# Patient Record
Sex: Female | Born: 1980 | Hispanic: Yes | Marital: Married | Smoking: Never smoker
Health system: Southern US, Community
[De-identification: ages and names within clinical notes are randomized; demographics above are authoritative.]

## PROBLEM LIST (undated history)

## (undated) DIAGNOSIS — I10 Essential (primary) hypertension: Secondary | ICD-10-CM

## (undated) DIAGNOSIS — K279 Peptic ulcer, site unspecified, unspecified as acute or chronic, without hemorrhage or perforation: Secondary | ICD-10-CM

## (undated) DIAGNOSIS — Z789 Other specified health status: Secondary | ICD-10-CM

## (undated) DIAGNOSIS — N189 Chronic kidney disease, unspecified: Secondary | ICD-10-CM

## (undated) DIAGNOSIS — E785 Hyperlipidemia, unspecified: Secondary | ICD-10-CM

## (undated) DIAGNOSIS — K760 Fatty (change of) liver, not elsewhere classified: Secondary | ICD-10-CM

## (undated) HISTORY — DX: Fatty (change of) liver, not elsewhere classified: K76.0

---

## 2005-05-04 ENCOUNTER — Ambulatory Visit: Payer: Self-pay | Admitting: Family Medicine

## 2005-05-08 ENCOUNTER — Ambulatory Visit: Payer: Self-pay | Admitting: Obstetrics and Gynecology

## 2005-05-08 ENCOUNTER — Ambulatory Visit: Payer: Self-pay | Admitting: Obstetrics & Gynecology

## 2005-05-08 ENCOUNTER — Inpatient Hospital Stay (HOSPITAL_COMMUNITY): Admission: AD | Admit: 2005-05-08 | Discharge: 2005-05-16 | Payer: Self-pay | Admitting: Obstetrics and Gynecology

## 2005-05-11 ENCOUNTER — Encounter (INDEPENDENT_AMBULATORY_CARE_PROVIDER_SITE_OTHER): Payer: Self-pay | Admitting: Specialist

## 2005-05-22 ENCOUNTER — Inpatient Hospital Stay (HOSPITAL_COMMUNITY): Admission: AD | Admit: 2005-05-22 | Discharge: 2005-05-22 | Payer: Self-pay | Admitting: Obstetrics & Gynecology

## 2005-05-25 ENCOUNTER — Ambulatory Visit: Payer: Self-pay | Admitting: Family Medicine

## 2007-04-18 ENCOUNTER — Ambulatory Visit (HOSPITAL_COMMUNITY): Admission: RE | Admit: 2007-04-18 | Discharge: 2007-04-18 | Payer: Self-pay | Admitting: Family Medicine

## 2007-04-19 ENCOUNTER — Inpatient Hospital Stay (HOSPITAL_COMMUNITY): Admission: AD | Admit: 2007-04-19 | Discharge: 2007-04-19 | Payer: Self-pay | Admitting: Obstetrics & Gynecology

## 2007-08-02 ENCOUNTER — Ambulatory Visit: Payer: Self-pay | Admitting: Advanced Practice Midwife

## 2007-08-02 ENCOUNTER — Inpatient Hospital Stay (HOSPITAL_COMMUNITY): Admission: AD | Admit: 2007-08-02 | Discharge: 2007-08-02 | Payer: Self-pay | Admitting: Obstetrics and Gynecology

## 2007-08-09 ENCOUNTER — Ambulatory Visit: Payer: Self-pay | Admitting: Obstetrics and Gynecology

## 2007-08-09 ENCOUNTER — Inpatient Hospital Stay (HOSPITAL_COMMUNITY): Admission: AD | Admit: 2007-08-09 | Discharge: 2007-08-12 | Payer: Self-pay | Admitting: Obstetrics and Gynecology

## 2007-08-14 ENCOUNTER — Ambulatory Visit: Payer: Self-pay | Admitting: Obstetrics & Gynecology

## 2010-12-20 NOTE — H&P (Signed)
NAME:  Nichole Jackson, Nichole Jackson NO.:  [id number]   MEDICAL RECORD NO.:  [id number]          PATIENT TYPE:  INP   LOCATION:  9160                          [facility]ILITY[facility]   PHYSICIAN:  Tilda Burrow, M.D. DATE OF BIRTH:  [birth year]   DATE OF ADMISSION:  08/09/2007  DATE OF DISCHARGE:                              HISTORY & PHYSICAL   This is 30 year old Hispanic female, gravida 2, para 1-0-0-1, prior C-  section x1, now at 41 weeks, 2 days by late second trimester ultrasound  (+/- 14 days), [redacted] weeks gestation at initial assessment.  Is admitted  for induction of labor to address the question of possible post dates.  She was followed through the health department for prenatal care.  Had  two periods in March, October 19, 2006 and October 30, 2006, raising  uncertain menstrual EDC.  Ultrasound on July 31, 2007 is the  suspected [facility] based on ultrasound performed on April 18, 2007.  She  has a prior history of cesarean section after a pregnancy for which she  became hypertensive, and at 38+ weeks gestation, was admitted to the  hospital for five days and induced due to elevated blood pressure and  reduced amniotic fluid with brief labor resulted in cesarean delivery  after she developed oligohydramnios.  This pregnancy has been relatively  uneventful, although the baby is perceived by the patient as being  larger.   Review of her records indicated that the patient had not signed the C-  section consent form, and prior to any interventions, we went over  vaginal birth after cesarean with risks of the procedure, including  specific review of the risks of uterine rupture with possible  complications of uterine rupture reviewed, including fetal damage and  injury.  She has specifically line by line reviewed the [facility]  clinic's consent for vaginal birth after cesarean, based on ACOG data,  and has signed this with all questions answered in great detail through  translator and directly.  The patient makes it clear that while she  would like to deliver vaginally, that if there become developed  concerns, she is completely comfortable with proceeding towards cesarean  delivery.   PAST MEDICAL HISTORY:  Benign.   PAST SURGICAL HISTORY:  C-section with complications of chorioamniotis  and postoperative wound infection.  She appears to have been admitted  for the postoperative infection as a readmission.   GENETIC HISTORY:  Negative.   FAMILY HISTORY:  Positive for hypertension and routine chronic diseases  only.   PHYSICAL EXAMINATION:  GENERAL:  A healthy Hispanic female, alert and  oriented x3.  Height 5 foot 4.  Weight approximately 200, which represents an  approximately 10 pound weight gain.  Pupils are equal, round and reactive.  NECK:  Supple.  CHEST:  Clear to auscultation.  ABDOMEN:  Gravid uterus.  Estimated fetal weight is 7-8 pounds.  Cervix is 2 cm, 50% posterior, -2, with vertex confirmed, and forewaters  palpable.   After consent is obtained, we placed a Foley bulb for cervical ripening  with 50 cc of fluid volume inserted.   Lab results, including hemoglobin initially  12, hematocrit 39, platelets  202.  Blood type O+.  Antibody screen negative.  RPR negative.  Hepatitis negative.  HIV nonreactive.  GC and Chlamydia negative.  Pap  smear normal.  One hour Glucola 105 mg%.  Group B strep test negative.   Review of records showed the primary low transverse C-section performed  in October, 2006.   PLAN:  Foley bulb cervical ripening overnight.  Pitocin induction to be  given in the a.m.  Uncertain prognosis for vaginal delivery.  Will  remove promptly to cesarean delivery if evidence of fetal concerns  occur.  Patient plans to use epidural for analgesia.  She does not plan  circumcision.      Tilda Burrow, M.D.  Electronically Signed     JVF/MEDQ  D:  08/10/2007  T:  08/10/2007  Job:  [id number]   cc:   [facility]  Pediatrics

## 2010-12-20 NOTE — Op Note (Signed)
Nichole Jackson, Nichole Jackson       ACCOUNT NO.:  [id number]   MEDICAL RECORD NO.:  [id number]          PATIENT TYPE:  INP   LOCATION:  9113                          [facility]   PHYSICIAN:  Tilda Burrow, M.D. DATE OF BIRTH:  11/10/1980   DATE OF PROCEDURE:  08/10/2007  DATE OF DISCHARGE:                               OPERATIVE REPORT   PREOPERATIVE DIAGNOSES:  Pregnancy 41+ weeks gestation.  Failed  induction, failed vaginal birth after cesarean attempt.   PROCEDURE:  Repeat cesarean section lower uterine segment vertical  incision with extension.   SURGEON:  Tilda Burrow, M.D.   ASSISTANT:  None.   ANESTHESIA:  Spinal.   COMPLICATIONS:  None.   FINDINGS:  Dense, firm fibrotic adhesions from the lower uterine segment  anterior abdominal wall obliterating the anterior dissection planes.  Also, extensive omental adhesions to the uterine fundus and anterior  portion of the uterus, relatively free adnexal structures.   DETAILS OF PROCEDURE:  The patient was taken to the operating room,  prepped and draped for Pfannenstiel type incision which was repeated and  sharp excision down through the fatty tissue to the fascia.  The fascia  was very dense, fibrotic and adherent to the rectus muscles.  Careful  dissection allowed adequacy of the facial opening.  The rectus muscles  were identified and opened in the midline, careful to remain high on the  abdomen.  We gradually worked our way in and encountered dense fibrotic  tissue, which required difficulty to dissect through.  Uterine  musculature was encountered with no space between fibrotic tissues and  uterus.  We marched even further cephalad with the fascial release and  we were able to identify a more flexible position that blunt digital  exam could allow [place] to identify the peritoneal opening.  Using the  operator's index finger to better delineate the anatomy, we could  identify that we were in the lower uterine  segment area midline.  Some  of the dense fibrosis could be cut free, but in order to access the  uterus and access the baby, we decided a vertical lower uterine segment  incision was necessary.  We then proceeded with this incision, going  down to the amniotic bag of water at the level of the fetal neck.  The  incision was extended caudad, while being careful to identify bladder  structures and pull them inferiorly.  A small cephalad extension of the  incision was necessary to maintain adequate access to rotate the baby  and deliver it by fundal pressure.  The cord was clamped and the infant  passed to awaiting pediatricians in good condition, with Apgars 8 and 9  assigned.  Amniotic fluid was clear, without malodor.  The infant was a  large framed infant, without any molding of the vertex.   The placenta was delivered by  __Crede_massage to the uterus and then  attention directed to the uterus.  The uterus was irrigated, membrane  remnants carefully inspected and extracted when identified.  The ring  forceps were placed at the upper and lower portion of the uterine  incision and the uterine  incision was then closed in the midline, with 2  layer running locking closure.  There was some asymmetry to the incision  at its top and it appeared there had been a transverse extension of the  incision.  Initially this was closed transversely and then after  reassessment, was taken down and closed vertically, as it became  apparent that this was due to muscular retraction of the sides of the  uterine incision.  This allowed for a successful second layer closure.  At this time, the uterus was found to have extensive fundal adhesions  and these could be dissected free, the tips of the omental pad were  inspected and confirmed as hemostatic.  The uterus could then be  exteriorized enough to confirm good hemostasis.  The rectus muscles were  loosely reapproximated with some 2-0 chromic, the fascia  closed using a  continuous running zero Vicryl and subcutaneous tissues were irrigated,  inspected, confirmed as hemostatic, closed with 2 interrupted sutures of  2-0 plain and staple closure of the skin completed procedure.   ESTIMATED BLOOD LOSS:  Estimated blood loss 600 to 800 mL.   CONDITION ON DISCHARGE:  Tolerated well by the patient.   Urine output was monitored throughout the case.  She had several hundred  mL of urine during the case, with no blood encountered.  At no time did  we suspect bladder injury.   ADDENDUM:  Should the patient go to repeat cesarean section in the  future, a lower abdominal vertical incision may be necessary, given the  density of the fibrosis and the technical challenges of access in the  pelvis.      Tilda Burrow, M.D.  Electronically Signed     JVF/MEDQ  D:  08/10/2007  T:  08/10/2007  Job:  [id number]

## 2010-12-20 NOTE — Discharge Summary (Signed)
NAMELYRIK, DOCKSTADER       ACCOUNT NO.:  [id number]   MEDICAL RECORD NO.:  [id number]          PATIENT TYPE:  INP   LOCATION:  9113                          [facility]   PHYSICIAN:  Tilda Burrow, M.D. DATE OF BIRTH:  March 24, 1981   DATE OF ADMISSION:  08/09/2007  DATE OF DISCHARGE:  08/12/2007                               DISCHARGE SUMMARY   REASON FOR ADMISSION:  Trial of labor, failed.  Repeat low transverse  cesarean section.   HOSPITAL COURSE:  Has been uneventful.  Patient is taking p.o. fluids  and food well without having any problems.  She is up, ambulating well  without difficulty.  She desires discharge home today.   PHYSICAL EXAMINATION:  VITAL SIGNS:  Stable.  HEART:  Regular to rhythm and rate.  LUNGS:  Clear to auscultation bilaterally.  ABDOMEN:  Soft.  Bowel sounds are active in all 4 quadrants.  Incision  is dry, intact.  No signs of redness, drainage or swelling.  Staples  intact.  Lochia is small amount.  Fundus is firm at -1 U.  There is 1+  edema in lower extremities.   DISCHARGE MEDICATIONS:  1. Micronor 1 p.o. q. day.  2. Motrin 600 one p.o. q.6 hours p.r.n. cramping.  3. Percocet 5/325 one p.o. q.4 hours p.r.n.  4. Colace 100 mg p.o. b.i.d.  5. Ferrous sulfate 325 one p.o. b.i.d.   PLAN:  We are going to discharge her home.  The [facility] nurse is to  see her at postop day 5, and remove her staples.  I reviewed with the  patient and her husband, who does speak English, signs and symptoms of  infection and need for return.  Also, I discussed with her importance of  taking the Micronor every day at the same time, and if she changes  contraceptive methods, to please let us know so we can put her on a  stronger pill if she wants to stop breast-feeding.      Zerita Boers, Lanier Clam      Tilda Burrow, M.D.  Electronically Signed   DL/MEDQ  D:  62/13/0865  T:  08/12/2007  Job:  [id number]   cc:   Tilda Burrow, M.D.  Fax:  [phone]

## 2010-12-23 NOTE — Discharge Summary (Signed)
NAMEESMIRNA, Nichole Jackson       ACCOUNT NO.:  [id number]   MEDICAL RECORD NO.:  [id number]          PATIENT TYPE:  WOC   LOCATION:  WOC                          FACILITY:  [facility]   PHYSICIAN:  Tanya S. Shawnie Pons, M.D.   DATE OF BIRTH:  1981/03/21   DATE OF ADMISSION:  05/08/2005  DATE OF DISCHARGE:  05/16/2005                                 DISCHARGE SUMMARY   ADMISSION DIAGNOSES:  1.  Rising blood pressure.  2.  Preeclampsia.  3.  Low platelets with oligohydramnios.   DISCHARGE DIAGNOSES:  1.  Term pregnancy delivered via low transverse cesarean section.  2.  Endometritis/preeclampsia/chorioamnionitis.   PROCEDURES:  1.  Low transverse c-section on May 12, 2005.  2.  Lower extremity Dopplers on May 15, 2005.  3.  A CT of the pelvic on May 16, 2005.  4.  Chest x-ray on May 14, 2005.  5.  KUB on May 14, 2005.   HISTORY OF PRESENT ILLNESS:  In brief, Nichole Jackson is a 30 year old  G1, now P1 who came in with severe preeclampsia, systolics were in the 180s  and platelets were less than 100, who underwent a low transverse c-section  secondary to failure to __________ fetal tachycardia as well as  chorioamnionitis.  The patient delivered a viable female infant at 87 on  May 12, 2005 with Apgars of 8 and 9.  The cord was clamped and cut.  pH  was 7.31.  Estimated blood loss was 1.3 L.  The patient was febrile post  partum for three days despite triple antibiotic therapy with Clindamycin,  gentamicin, and ampicillin.  The fever resolved on postoperative day #4.  At  discharge the patient was afebrile for greater than 24 hours.  Blood and  urine cultures were sent, which were negative to date.  Lower extremity  Dopplers were negative for DVT.  A CT of the pelvis was negative for  abscess.  A chest x-ray showed no acute distress and a KUB with no evidence  of perforation.   The patient was O positive, Rubella immune, GBS positive.  She delivered a  female with  no circumcision.  She was discharged with a prescription for  Micronor for contraception.   DISCHARGE LABORATORIES:  Include blood and urine cultures no growth to date.  Urinalysis that reveals a specific gravity of 1.030, large blood, protein  30, white  blood cells 3-6, red blood cells 3-6, few casts.  A CBC with a  white blood cell count of 10.6, hemoglobin 9.5, hematocrit 28, platelet  count 238.  A comprehensive metabolic panel with a sodium 139, potassium  4.3, chloride 102, CO2 of 24, glucose 80, BUN 4, creatinine of 0.8, alkaline  phosphatase of 124.  AST of 98, LP of 52.  Uric acid of 7.6.   DISCHARGE INSTRUCTIONS:  Nothing in the vagina for six weeks.  Routine diet.   MEDICATIONS:  Include:  1.  Ibuprofen.  2.  Percocet.  3.  Prenatal vitamins.  4.  Micronor.   DISPOSITION:  The patient was discharged to home.   FOLLOW UP:  Instructed to follow up in six weeks  at [facility].   ADDENDUM:  The baby's weight was 6 pounds 9 ounces and 20 inches.      Alanson Puls, M.D.    ______________________________  Shelbie Proctor. Shawnie Pons, M.D.    MR/MEDQ  D:  05/16/2005  T:  05/16/2005  Job:  [id number]

## 2010-12-23 NOTE — Op Note (Signed)
NAME:  Nichole Jackson, Nichole Jackson NO.:  [id number]   MEDICAL RECORD NO.:  [id number]          PATIENT TYPE:  INP   LOCATION:                                [facility]   PHYSICIAN:  Lesly Dukes, M.D. DATE OF BIRTH:  [birth year]   DATE OF PROCEDURE:  05/11/2005  DATE OF DISCHARGE:                               OPERATIVE REPORT   PREOPERATIVE DIAGNOSIS:  A 30 year old female at 46 weeks estimated  gestational age, with severe preeclampsia and failure to progress.   POSTOPERATIVE DIAGNOSIS:  A 30 year old female at 38 weeks estimated  gestational age, with severe preeclampsia and failure to progress.   PROCEDURE:  Primary low transverse cesarean section.   SURGEON:  Dr. Elsie Lincoln.   ASSISTANT:  1. Dr. Kathlyn Sacramento.  2. Dr. Tinnie Gens.   EBL:  1300 mL.   COMPLICATIONS:  None.   FINDINGS:  1. Viable female infant.  2. Apgars of 8 at one minute and 9 at five minutes.  3. Arterial cord pH 7.31.  4. Grossly normal uterus, ovaries and fallopian tubes.   PROCEDURE:  After informed consent was obtained the patient was taken to  the operating room where a NST was found to be adequate.  The patient  was placed in the dorsal supine position with a leftward tilt and  prepared and draped in normal sterile fashion, a Foley was in the  bladder.  A Pfannenstiel skin incision was made with a scalpel and  carried down to the fascia.  The fascia was incised in the midline and  extended bilaterally with the Mayo scissors.  The superior and inferior  aspects of the fascia were grasped with kocher clamps, tented up, and  dissected off sharply and bluntly from underlying layers of rectus  muscles.  The rectus muscules were separated in the midline.  The  peritoneum was tented up and entered sharply with the Metzenbaum  scissors.  This incision was extended with good visualization of the  bladder.  A bladder blade was inserted.  The vesicouterine peritoneum  was tented up and  entered sharply with the Metzenbaum scissors.  This  incision was extended bilaterally and a bladder flap was created  digitally.  The bladder blade was reinserted.  The uterus was then  incised in a transverse fashion in the lower uterine segment.  Baby's  head delivered atraumatically and the nose and mouth were suctioned.  The rest of the baby's body delivered easily.  The cord was clamped and  cut, the baby was handed off to the waiting pediatrician.  The arterial  cord gas was sent and the cord blood was sent for type and screen.  Baby's placenta delivered easily and had a three-vessel cord.  The  uterus was cleared of all clots and debris and was noted to be mildly  atonic.  Pitocin was given.  The uterus was closed with #0 Vicryl in a  running locked fashion.  Good hemostasis was noted.  The uterus noted to  be hemostatic on tension.  The peritoneal caviey was copiously irrigated  and the uterine incision was found to be hemostatic one  last time.  The  peritoneum, rectus muscles were noted to be hemostatic.  The fascia was  closed with #0 Vicryl in a running fashion.  The subcutaneous tissue was  copiously irrigated.  The skin was then closed with staples.  The  patient tolerated the procedure well.  Sponge, lap, instrument and  needle count were correct x2.  The patient went to the recovery room in  stable condition.      Lesly Dukes, M.D.  Electronically Signed     KHL/MEDQ  D:  05/08/2007  T:  05/09/2007  Job:  [id number]

## 2011-02-20 ENCOUNTER — Other Ambulatory Visit: Payer: Self-pay | Admitting: Family Medicine

## 2011-02-20 DIAGNOSIS — Z3689 Encounter for other specified antenatal screening: Secondary | ICD-10-CM

## 2011-02-20 LAB — GC/CHLAMYDIA PROBE AMP, GENITAL
Chlamydia: NEGATIVE
Chlamydia: NEGATIVE
Gonorrhea: NEGATIVE
Gonorrhea: NEGATIVE

## 2011-02-20 LAB — HIV ANTIBODY (ROUTINE TESTING W REFLEX): HIV: NONREACTIVE

## 2011-02-20 LAB — RPR
RPR: NONREACTIVE
RPR: NONREACTIVE

## 2011-02-20 LAB — ABO/RH: RH Type: POSITIVE

## 2011-02-23 ENCOUNTER — Ambulatory Visit (HOSPITAL_COMMUNITY)
Admission: RE | Admit: 2011-02-23 | Discharge: 2011-02-23 | Disposition: A | Discharge status: Home / Self Care | Payer: Medicaid Other | Source: Ambulatory Visit | Attending: Family Medicine | Admitting: Family Medicine

## 2011-02-23 DIAGNOSIS — Z3689 Encounter for other specified antenatal screening: Secondary | ICD-10-CM

## 2011-02-23 DIAGNOSIS — O358XX0 Maternal care for other (suspected) fetal abnormality and damage, not applicable or unspecified: Secondary | ICD-10-CM | POA: Insufficient documentation

## 2011-02-23 DIAGNOSIS — Z1389 Encounter for screening for other disorder: Secondary | ICD-10-CM | POA: Insufficient documentation

## 2011-02-23 DIAGNOSIS — Z363 Encounter for antenatal screening for malformations: Secondary | ICD-10-CM | POA: Insufficient documentation

## 2011-03-20 ENCOUNTER — Other Ambulatory Visit: Payer: Self-pay | Admitting: Family Medicine

## 2011-03-20 DIAGNOSIS — Z3689 Encounter for other specified antenatal screening: Secondary | ICD-10-CM

## 2011-03-22 ENCOUNTER — Ambulatory Visit (HOSPITAL_COMMUNITY)
Admission: RE | Admit: 2011-03-22 | Discharge: 2011-03-22 | Disposition: A | Discharge status: Home / Self Care | Payer: Medicaid Other | Source: Ambulatory Visit | Attending: Family Medicine | Admitting: Family Medicine

## 2011-03-22 ENCOUNTER — Other Ambulatory Visit: Payer: Self-pay | Admitting: Family Medicine

## 2011-03-22 DIAGNOSIS — E669 Obesity, unspecified: Secondary | ICD-10-CM | POA: Insufficient documentation

## 2011-03-22 DIAGNOSIS — Z3689 Encounter for other specified antenatal screening: Secondary | ICD-10-CM

## 2011-03-22 DIAGNOSIS — O365990 Maternal care for other known or suspected poor fetal growth, unspecified trimester, not applicable or unspecified: Secondary | ICD-10-CM | POA: Insufficient documentation

## 2011-04-27 LAB — RPR: RPR Ser Ql: NONREACTIVE

## 2011-04-27 LAB — CBC
HCT: 34.3 — ABNORMAL LOW
HCT: 40.6
Hemoglobin: 11.9 — ABNORMAL LOW
Hemoglobin: 14.4
MCHC: 34.8
MCHC: 35.4
MCV: 88.7
MCV: 90.3
Platelets: 131 — ABNORMAL LOW
Platelets: 135 — ABNORMAL LOW
RBC: 3.8 — ABNORMAL LOW
RBC: 4.58
RDW: 13.4
RDW: 13.9
WBC: 11.5 — ABNORMAL HIGH
WBC: 13.5 — ABNORMAL HIGH

## 2011-04-27 LAB — BASIC METABOLIC PANEL
BUN: 2 — ABNORMAL LOW
CO2: 29
Calcium: 7.9 — ABNORMAL LOW
Chloride: 102
Creatinine, Ser: 0.57
GFR calc Af Amer: >60
GFR calc non Af Amer: >60
Glucose, Bld: 97
Potassium: 3.5
Sodium: 137

## 2011-04-27 LAB — ABO/RH: ABO/RH(D): O POS

## 2011-04-27 LAB — TYPE AND SCREEN
ABO/RH(D): O POS
Antibody Screen: NEGATIVE

## 2011-05-19 LAB — URINALYSIS, ROUTINE W REFLEX MICROSCOPIC
Bilirubin Urine: NEGATIVE
Glucose, UA: NEGATIVE
Hgb urine dipstick: NEGATIVE
Ketones, ur: NEGATIVE
Nitrite: NEGATIVE
Protein, ur: NEGATIVE
Specific Gravity, Urine: 1.01
Urobilinogen, UA: 0.2
pH: 7.5

## 2011-05-19 LAB — WET PREP, GENITAL
Clue Cells Wet Prep HPF POC: NONE SEEN
Trich, Wet Prep: NONE SEEN
Yeast Wet Prep HPF POC: NONE SEEN

## 2011-05-19 LAB — URINE MICROSCOPIC-ADD ON

## 2011-07-14 ENCOUNTER — Other Ambulatory Visit: Payer: Self-pay | Admitting: Obstetrics & Gynecology

## 2011-07-21 ENCOUNTER — Other Ambulatory Visit (HOSPITAL_COMMUNITY): Payer: Medicaid Other

## 2011-07-24 ENCOUNTER — Encounter (HOSPITAL_COMMUNITY): Payer: Self-pay

## 2011-07-24 ENCOUNTER — Encounter (HOSPITAL_COMMUNITY): Payer: Self-pay | Admitting: *Deleted

## 2011-07-25 ENCOUNTER — Encounter (HOSPITAL_COMMUNITY): Payer: Self-pay

## 2011-07-25 ENCOUNTER — Encounter (HOSPITAL_COMMUNITY)
Admission: RE | Admit: 2011-07-25 | Discharge: 2011-07-25 | Disposition: A | Discharge status: Home / Self Care | Payer: Medicaid Other | Source: Ambulatory Visit | Attending: Obstetrics & Gynecology | Admitting: Obstetrics & Gynecology

## 2011-07-25 HISTORY — DX: Chronic kidney disease, unspecified: N18.9

## 2011-07-25 HISTORY — DX: Other specified health status: Z78.9

## 2011-07-25 LAB — CBC
HCT: 39.8 % (ref 36.0–46.0)
Hemoglobin: 13.2 g/dL (ref 12.0–15.0)
MCV: 89.4 fL (ref 78.0–100.0)
RBC: 4.45 MIL/uL (ref 3.87–5.11)
WBC: 8.9 10*3/uL (ref 4.0–10.5)

## 2011-07-25 LAB — RPR: RPR Ser Ql: NONREACTIVE

## 2011-07-25 LAB — SURGICAL PCR SCREEN: Staphylococcus aureus: POSITIVE — AB

## 2011-07-25 NOTE — Pre-Procedure Instructions (Signed)
Interpreter Eda Royal assisted with PAT interview.

## 2011-07-25 NOTE — Patient Instructions (Addendum)
YOUR PROCEDURE IS SCHEDULED ON:07/27/11 ENTER THROUGH THE [facility]TR[facility] AT:1230 pm  USE DESK PHONE AND DIAL [phone] TO INFORM us OF YOUR ARRIVAL  CALL [phone] IF YOU HAVE ANY QUESTIONS OR PROBLEMS PRIOR TO YOUR ARRIVAL.  REMEMBER: DO NOT EAT AFTER MIDNIGHT :Wed  SPECIAL INSTRUCTIONS:clear liquids ok until 10 am on Thursday   YOU MAY BRUSH YOUR TEETH THE MORNING OF SURGERY   TAKE THESE MEDICINES THE DAY OF SURGERY WITH SIP OF WATER:   DO NOT WEAR JEWELRY, EYE MAKEUP, LIPSTICK OR DARK FINGERNAIL POLISH DO NOT WEAR LOTIONS  DO NOT SHAVE FOR 48 HOURS PRIOR TO SURGERY  YOU WILL NOT BE ALLOWED TO DRIVE YOURSELF HOME.  NAME OF DRIVER:spouse- Kern Alberta [id number]

## 2011-07-26 NOTE — Pre-Procedure Instructions (Signed)
Nichole Jackson, interpreter, called pt's cell phone number and left message telling pt to get her Mupiricin RX filled.

## 2011-07-27 ENCOUNTER — Inpatient Hospital Stay (HOSPITAL_COMMUNITY): Payer: Medicaid Other | Admitting: Anesthesiology

## 2011-07-27 ENCOUNTER — Encounter (HOSPITAL_COMMUNITY): Payer: Self-pay | Admitting: Anesthesiology

## 2011-07-27 ENCOUNTER — Inpatient Hospital Stay (HOSPITAL_COMMUNITY)
Admission: RE | Admit: 2011-07-27 | Discharge: 2011-07-29 | DRG: 766 | Disposition: A | Discharge status: Home / Self Care | Payer: Medicaid Other | Source: Ambulatory Visit | Attending: Obstetrics & Gynecology | Admitting: Obstetrics & Gynecology

## 2011-07-27 ENCOUNTER — Encounter (HOSPITAL_COMMUNITY)
Admission: RE | Disposition: A | Discharge status: Home / Self Care | Payer: Self-pay | Source: Ambulatory Visit | Attending: Obstetrics & Gynecology

## 2011-07-27 DIAGNOSIS — O480 Post-term pregnancy: Secondary | ICD-10-CM

## 2011-07-27 DIAGNOSIS — O34219 Maternal care for unspecified type scar from previous cesarean delivery: Principal | ICD-10-CM | POA: Diagnosis present

## 2011-07-27 SURGERY — Surgical Case
Anesthesia: Regional | Site: Abdomen | Wound class: Clean Contaminated

## 2011-07-27 MED ORDER — OXYTOCIN 20 UNITS IN LACTATED RINGERS INFUSION - SIMPLE
INTRAVENOUS | Status: DC | PRN
  Administered 2011-07-27: 20 [IU] via INTRAVENOUS
  Administered 2011-07-27: 40 [IU] via INTRAVENOUS

## 2011-07-27 MED ORDER — ONDANSETRON HCL 4 MG/2ML IJ SOLN
INTRAMUSCULAR | Status: DC | PRN
  Administered 2011-07-27: 4 mg via INTRAVENOUS

## 2011-07-27 MED ORDER — MENTHOL 3 MG MT LOZG: 1.0000 | LOZENGE | OROMUCOSAL | Status: DC | PRN

## 2011-07-27 MED ORDER — LACTATED RINGERS IV SOLN
INTRAVENOUS | Status: DC
  Administered 2011-07-28: 07:00:00 via INTRAVENOUS

## 2011-07-27 MED ORDER — SCOPOLAMINE 1 MG/3DAYS TD PT72: 1.0000 | MEDICATED_PATCH | Freq: Once | TRANSDERMAL | Status: DC

## 2011-07-27 MED ORDER — ONDANSETRON HCL 4 MG PO TABS: 4.0000 mg | ORAL_TABLET | ORAL | Status: DC | PRN

## 2011-07-27 MED ORDER — CEFAZOLIN SODIUM 1-5 GM-% IV SOLN: 1.0000 g | INTRAVENOUS | Status: DC

## 2011-07-27 MED ORDER — CEFAZOLIN SODIUM-DEXTROSE 2-3 GM-% IV SOLR: 2.0000 g | INTRAVENOUS | Status: DC

## 2011-07-27 MED ORDER — LACTATED RINGERS IV SOLN
INTRAVENOUS | Status: DC
  Administered 2011-07-27 (×4): via INTRAVENOUS

## 2011-07-27 MED ORDER — DIBUCAINE 1 % RE OINT: 1.0000 "application " | TOPICAL_OINTMENT | RECTAL | Status: DC | PRN

## 2011-07-27 MED ORDER — NALOXONE HCL 0.4 MG/ML IJ SOLN: 0.4000 mg | INTRAMUSCULAR | Status: DC | PRN

## 2011-07-27 MED ORDER — LIDOCAINE-EPINEPHRINE (PF) 2 %-1:200000 IJ SOLN
INTRAMUSCULAR | Status: DC | PRN
  Administered 2011-07-27: 2 mL

## 2011-07-27 MED ORDER — DIPHENHYDRAMINE HCL 25 MG PO CAPS
25.0000 mg | ORAL_CAPSULE | ORAL | Status: DC | PRN
  Filled 2011-07-27: qty 1

## 2011-07-27 MED ORDER — MEPERIDINE HCL 25 MG/ML IJ SOLN: 6.2500 mg | INTRAMUSCULAR | Status: DC | PRN

## 2011-07-27 MED ORDER — OXYTOCIN 10 UNIT/ML IJ SOLN
INTRAMUSCULAR | Status: AC
  Filled 2011-07-27: qty 2

## 2011-07-27 MED ORDER — ONDANSETRON HCL 4 MG/2ML IJ SOLN
INTRAMUSCULAR | Status: AC
  Filled 2011-07-27: qty 2

## 2011-07-27 MED ORDER — OXYTOCIN 10 UNIT/ML IJ SOLN
INTRAMUSCULAR | Status: AC
  Filled 2011-07-27: qty 4

## 2011-07-27 MED ORDER — FENTANYL CITRATE 0.05 MG/ML IJ SOLN
INTRAMUSCULAR | Status: AC
  Filled 2011-07-27: qty 2

## 2011-07-27 MED ORDER — ONDANSETRON HCL 4 MG/2ML IJ SOLN: 4.0000 mg | INTRAMUSCULAR | Status: DC | PRN

## 2011-07-27 MED ORDER — TETANUS-DIPHTH-ACELL PERTUSSIS 5-2.5-18.5 LF-MCG/0.5 IM SUSP
0.5000 mL | Freq: Once | INTRAMUSCULAR | Status: DC
  Filled 2011-07-27: qty 0.5

## 2011-07-27 MED ORDER — VANCOMYCIN HCL IN DEXTROSE 1-5 GM/200ML-% IV SOLN
1000.0000 mg | Freq: Once | INTRAVENOUS | Status: AC
  Administered 2011-07-27: 1000 mg via INTRAVENOUS
  Filled 2011-07-27: qty 200

## 2011-07-27 MED ORDER — SODIUM CHLORIDE 0.9 % IV SOLN: 1.0000 ug/kg/h | INTRAVENOUS | Status: DC | PRN

## 2011-07-27 MED ORDER — SODIUM CHLORIDE 0.9 % IJ SOLN: 3.0000 mL | INTRAMUSCULAR | Status: DC | PRN

## 2011-07-27 MED ORDER — KETOROLAC TROMETHAMINE 30 MG/ML IJ SOLN: 30.0000 mg | Freq: Four times a day (QID) | INTRAMUSCULAR | Status: AC | PRN

## 2011-07-27 MED ORDER — IBUPROFEN 600 MG PO TABS
600.0000 mg | ORAL_TABLET | Freq: Four times a day (QID) | ORAL | Status: DC | PRN
  Filled 2011-07-27 (×4): qty 1

## 2011-07-27 MED ORDER — CEFAZOLIN SODIUM 1-5 GM-% IV SOLN
INTRAVENOUS | Status: AC
  Filled 2011-07-27: qty 50

## 2011-07-27 MED ORDER — HYDROMORPHONE HCL PF 1 MG/ML IJ SOLN
0.2500 mg | INTRAMUSCULAR | Status: DC | PRN
Start: 2011-07-27 — End: 2011-07-27
  Administered 2011-07-27 (×4): 0.5 mg via INTRAVENOUS

## 2011-07-27 MED ORDER — IBUPROFEN 600 MG PO TABS
600.0000 mg | ORAL_TABLET | Freq: Four times a day (QID) | ORAL | Status: DC
  Administered 2011-07-27 – 2011-07-29 (×6): 600 mg via ORAL
  Filled 2011-07-27 (×2): qty 1

## 2011-07-27 MED ORDER — HYDROMORPHONE HCL PF 1 MG/ML IJ SOLN
INTRAMUSCULAR | Status: AC
  Administered 2011-07-27: 0.5 mg via INTRAVENOUS
  Filled 2011-07-27: qty 1

## 2011-07-27 MED ORDER — LANOLIN HYDROUS EX OINT: 1.0000 "application " | TOPICAL_OINTMENT | CUTANEOUS | Status: DC | PRN

## 2011-07-27 MED ORDER — PHENYLEPHRINE HCL 10 MG/ML IJ SOLN
INTRAMUSCULAR | Status: DC | PRN
  Administered 2011-07-27: 40 ug via INTRAVENOUS
  Administered 2011-07-27 (×2): 80 ug via INTRAVENOUS

## 2011-07-27 MED ORDER — MUPIROCIN 2 % EX OINT
TOPICAL_OINTMENT | CUTANEOUS | Status: AC
  Administered 2011-07-27: 14:00:00
  Filled 2011-07-27: qty 22

## 2011-07-27 MED ORDER — SIMETHICONE 80 MG PO CHEW: 80.0000 mg | CHEWABLE_TABLET | ORAL | Status: DC | PRN

## 2011-07-27 MED ORDER — KETOROLAC TROMETHAMINE 60 MG/2ML IM SOLN
60.0000 mg | Freq: Once | INTRAMUSCULAR | Status: AC | PRN
  Filled 2011-07-27: qty 2

## 2011-07-27 MED ORDER — DIPHENHYDRAMINE HCL 50 MG/ML IJ SOLN
12.5000 mg | INTRAMUSCULAR | Status: DC | PRN
  Administered 2011-07-27: 12.5 mg via INTRAVENOUS

## 2011-07-27 MED ORDER — METOCLOPRAMIDE HCL 5 MG/ML IJ SOLN: 10.0000 mg | Freq: Three times a day (TID) | INTRAMUSCULAR | Status: DC | PRN

## 2011-07-27 MED ORDER — DIPHENHYDRAMINE HCL 25 MG PO CAPS: 25.0000 mg | ORAL_CAPSULE | Freq: Four times a day (QID) | ORAL | Status: DC | PRN

## 2011-07-27 MED ORDER — WITCH HAZEL-GLYCERIN EX PADS: 1.0000 "application " | MEDICATED_PAD | CUTANEOUS | Status: DC | PRN

## 2011-07-27 MED ORDER — NALBUPHINE HCL 10 MG/ML IJ SOLN
5.0000 mg | INTRAMUSCULAR | Status: DC | PRN
  Filled 2011-07-27: qty 1

## 2011-07-27 MED ORDER — NALBUPHINE HCL 10 MG/ML IJ SOLN
5.0000 mg | INTRAMUSCULAR | Status: DC | PRN
  Administered 2011-07-27: 5 mg via INTRAVENOUS
  Filled 2011-07-27 (×2): qty 1

## 2011-07-27 MED ORDER — OXYCODONE-ACETAMINOPHEN 5-325 MG PO TABS
1.0000 | ORAL_TABLET | ORAL | Status: DC | PRN
  Administered 2011-07-29: 2 via ORAL
  Filled 2011-07-27: qty 1

## 2011-07-27 MED ORDER — CEFAZOLIN SODIUM 1-5 GM-% IV SOLN
INTRAVENOUS | Status: DC | PRN
  Administered 2011-07-27: 1 g via INTRAVENOUS

## 2011-07-27 MED ORDER — DIPHENHYDRAMINE HCL 50 MG/ML IJ SOLN: 25.0000 mg | INTRAMUSCULAR | Status: DC | PRN

## 2011-07-27 MED ORDER — PHENYLEPHRINE 40 MCG/ML (10ML) SYRINGE FOR IV PUSH (FOR BLOOD PRESSURE SUPPORT)
PREFILLED_SYRINGE | INTRAVENOUS | Status: AC
  Filled 2011-07-27: qty 5

## 2011-07-27 MED ORDER — PRENATAL MULTIVITAMIN CH
1.0000 | ORAL_TABLET | Freq: Every day | ORAL | Status: DC
  Administered 2011-07-28: 1 via ORAL
  Filled 2011-07-27: qty 1

## 2011-07-27 MED ORDER — OXYTOCIN 20 UNITS IN LACTATED RINGERS INFUSION - SIMPLE
125.0000 mL/h | INTRAVENOUS | Status: AC
  Administered 2011-07-27: 125 mL/h via INTRAVENOUS
  Filled 2011-07-27 (×2): qty 1000

## 2011-07-27 MED ORDER — DIPHENHYDRAMINE HCL 50 MG/ML IJ SOLN
INTRAMUSCULAR | Status: AC
  Administered 2011-07-27: 12.5 mg via INTRAVENOUS
  Filled 2011-07-27: qty 1

## 2011-07-27 MED ORDER — LIDOCAINE-EPINEPHRINE (PF) 2 %-1:200000 IJ SOLN
INTRAMUSCULAR | Status: AC
  Filled 2011-07-27: qty 20

## 2011-07-27 MED ORDER — ONDANSETRON HCL 4 MG/2ML IJ SOLN: 4.0000 mg | Freq: Three times a day (TID) | INTRAMUSCULAR | Status: DC | PRN

## 2011-07-27 MED ORDER — MORPHINE SULFATE (PF) 0.5 MG/ML IJ SOLN
INTRAMUSCULAR | Status: DC | PRN
  Administered 2011-07-27: 3 mg via EPIDURAL

## 2011-07-27 MED ORDER — MEASLES, MUMPS & RUBELLA VAC ~~LOC~~ INJ: 0.5000 mL | INJECTION | Freq: Once | SUBCUTANEOUS | Status: DC

## 2011-07-27 MED ORDER — MORPHINE SULFATE 0.5 MG/ML IJ SOLN
INTRAMUSCULAR | Status: AC
  Filled 2011-07-27: qty 10

## 2011-07-27 MED ORDER — SCOPOLAMINE 1 MG/3DAYS TD PT72
1.0000 | MEDICATED_PATCH | TRANSDERMAL | Status: DC
  Administered 2011-07-27: 1.5 mg via TRANSDERMAL

## 2011-07-27 MED ORDER — BUPIVACAINE IN DEXTROSE 0.75-8.25 % IT SOLN
INTRATHECAL | Status: AC
  Filled 2011-07-27: qty 2

## 2011-07-27 SURGICAL SUPPLY — 35 items
CELLS DAT CNTRL 66122 CELL SVR (MISCELLANEOUS) ×1 IMPLANT
CHLORAPREP W/TINT 26ML (MISCELLANEOUS) ×2 IMPLANT
CLOTH BEACON ORANGE TIMEOUT ST (SAFETY) ×2 IMPLANT
CONTAINER PREFILL 10% NBF 15ML (MISCELLANEOUS) IMPLANT
DRAIN JACKSON PRT FLT 7MM (DRAIN) IMPLANT
DRESSING TELFA 8X3 (GAUZE/BANDAGES/DRESSINGS) ×1 IMPLANT
DRSG PAD ABDOMINAL 8X10 ST (GAUZE/BANDAGES/DRESSINGS) ×1 IMPLANT
ELECT REM PT RETURN 9FT ADLT (ELECTROSURGICAL) ×2
ELECTRODE REM PT RTRN 9FT ADLT (ELECTROSURGICAL) ×1 IMPLANT
EVACUATOR SILICONE 100CC (DRAIN) IMPLANT
EXTRACTOR VACUUM M CUP 4 TUBE (SUCTIONS) ×1 IMPLANT
GAUZE SPONGE 4X4 12PLY STRL LF (GAUZE/BANDAGES/DRESSINGS) ×4 IMPLANT
GLOVE BIO SURGEON STRL SZ7 (GLOVE) ×2 IMPLANT
GLOVE BIOGEL PI IND STRL 7.0 (GLOVE) ×2 IMPLANT
GLOVE BIOGEL PI INDICATOR 7.0 (GLOVE) ×2
GOWN PREVENTION PLUS LG XLONG (DISPOSABLE) ×6 IMPLANT
KIT ABG SYR 3ML LUER SLIP (SYRINGE) IMPLANT
NDL HYPO 25X5/8 SAFETYGLIDE (NEEDLE) ×1 IMPLANT
NEEDLE HYPO 25X5/8 SAFETYGLIDE (NEEDLE) ×2 IMPLANT
NS IRRIG 1000ML POUR BTL (IV SOLUTION) ×2 IMPLANT
PACK C SECTION WH (CUSTOM PROCEDURE TRAY) ×2 IMPLANT
PAD ABD 7.5X8 STRL (GAUZE/BANDAGES/DRESSINGS) IMPLANT
RETRACTOR WND ALEXIS 18 MED (MISCELLANEOUS) IMPLANT
RTRCTR C-SECT PINK 25CM LRG (MISCELLANEOUS) ×2 IMPLANT
RTRCTR WOUND ALEXIS 18CM MED (MISCELLANEOUS) ×2
SLEEVE SCD COMPRESS KNEE MED (MISCELLANEOUS) IMPLANT
SPONGE GAUZE 4X4 12PLY (GAUZE/BANDAGES/DRESSINGS) ×1 IMPLANT
STAPLER VISISTAT 35W (STAPLE) IMPLANT
SUT VIC AB 0 CTX 36 (SUTURE) ×10
SUT VIC AB 0 CTX36XBRD ANBCTRL (SUTURE) ×5 IMPLANT
SUT VIC AB 4-0 KS 27 (SUTURE) IMPLANT
TAPE CLOTH SURG 4X10 WHT LF (GAUZE/BANDAGES/DRESSINGS) ×1 IMPLANT
TOWEL OR 17X24 6PK STRL BLUE (TOWEL DISPOSABLE) ×4 IMPLANT
TRAY FOLEY CATH 14FR (SET/KITS/TRAYS/PACK) ×2 IMPLANT
WATER STERILE IRR 1000ML POUR (IV SOLUTION) ×1 IMPLANT

## 2011-07-27 NOTE — Anesthesia Preprocedure Evaluation (Signed)
Anesthesia Evaluation  Patient identified by MRN, date of birth, ID band Patient awake    Reviewed: Allergy & Precautions, H&P , Patient's Chart, lab work & pertinent test results  Airway Mallampati: II TM Distance: >3 FB Neck ROM: full    Dental No notable dental hx.    Pulmonary  clear to auscultation  Pulmonary exam normal       Cardiovascular Exercise Tolerance: Good regular Normal    Neuro/Psych    GI/Hepatic   Endo/Other  Morbid obesity  Renal/GU      Musculoskeletal   Abdominal   Peds  Hematology   Anesthesia Other Findings   Reproductive/Obstetrics                           Anesthesia Physical Anesthesia Plan  ASA: III  Anesthesia Plan: Combined Spinal and Epidural   Post-op Pain Management:    Induction:   Airway Management Planned:   Additional Equipment:   Intra-op Plan:   Post-operative Plan:   Informed Consent: I have reviewed the patients History and Physical, chart, labs and discussed the procedure including the risks, benefits and alternatives for the proposed anesthesia with the patient or authorized representative who has indicated his/her understanding and acceptance.   Dental Advisory Given  Plan Discussed with: CRNA  Anesthesia Plan Comments: (Lab work confirmed with CRNA in room. Platelets okay. Discussed spinal anesthetic, and patient consents to the procedure:  included risk of possible headache,backache, failed block, allergic reaction, and nerve injury. This patient was asked if she had any questions or concerns before the procedure started. )        Anesthesia Quick Evaluation  

## 2011-07-27 NOTE — Addendum Note (Signed)
Addendum  created 07/27/11 1824 by Fanny Dance   Modules edited:Charges VN

## 2011-07-27 NOTE — Consult Note (Signed)
Neonatology Note:  Attendance at C-section:  I was asked to attend this repeat C/S at term. The mother is a G3P2 O pos, GBS unknown with a history of kidney stones and depression. She is positive for MRSA. ROM at delivery, fluid clear. Infant vigorous with good spontaneous cry and tone. Needed only minimal bulb suctioning. Ap 8/9. Lungs clear to ausc in DR. To CN to care of Pediatrician.  Nichole Withem, MD  

## 2011-07-27 NOTE — Transfer of Care (Signed)
Immediate Anesthesia Transfer of Care Note  Patient: Nichole Jackson  Procedure(s) Performed:  CESAREAN SECTION - Previous classical  Patient Location: PACU  Anesthesia Type: Spinal  Level of Consciousness: alert  and oriented  Airway & Oxygen Therapy: Patient Spontanous Breathing  Post-op Assessment: Report given to PACU RN and Post -op Vital signs reviewed and stable  Post vital signs: stable  Complications: No apparent anesthesia complications

## 2011-07-27 NOTE — H&P (Addendum)
Nichole Jackson is a 30 y.o. female presenting for repeat c/s section.  Pt has a history of a classical c/s.  The incision extension went into the contractile portion of the uterus per old op note.  Discussed case with Dr. Emelda Fear who did the last c/s and considers it a classical c/s.  As per recommendations with MFM, we are delivering at 37 weeks.  No problems today.  Pt has history of MRSA but did not get Bactroban prescription filled.  History OB History    Grav Para Term Preterm Abortions TAB SAB Ect Mult Living   3 2 2       2      Past Medical History  Diagnosis Date  . Chronic kidney disease     stones  . No pertinent past medical history   History of depression   Past Surgical History  Procedure Date  . Cesarean section     x 2   Family History: mom had history of being on dialysis before her death; hypercholesterolemia.  Social History:  reports that she has never smoked. She does not have any smokeless tobacco history on file. She reports that she does not drink alcohol. Her drug history not on file.  Review of Systems  Constitutional: Negative.   HENT: Negative.   Eyes: Negative.   Respiratory: Negative.   Cardiovascular: Negative.   Gastrointestinal: Negative.   Genitourinary: Negative.   Musculoskeletal: Negative.   Psychiatric/Behavioral: Negative.       Last menstrual period 10/05/2010. Exam Physical Exam  Constitutional: She is oriented to person, place, and time. She appears well-developed and well-nourished. No distress.  HENT:  Head: Normocephalic and atraumatic.  Mouth/Throat: No oropharyngeal exudate.  Neck: No thyromegaly present.  Cardiovascular: Normal rate and regular rhythm.   Respiratory: Breath sounds normal.  GI: Soft. There is no tenderness. There is no rebound.  Neurological: She is alert and oriented to person, place, and time.  Skin: Skin is warm and dry.  Psychiatric: She has a normal mood and affect.    Prenatal labs: ABO,  Rh: O/Positive/-- (07/16 0000) Antibody:   Rubella: Immune (07/16 0000) RPR: NON REACTIVE (12/18 1059)  HBsAg: Negative (07/16 0000)  HIV: Non-reactive (07/16 0000)  GBS:     Assessment/Plan: 89 P2 female with history of classical c/s for rpt c/s early due to risk of uterine rupture if labor occurs.  Will continue MRSA order set pp. Patient consented fro cesarean section.  Risks include but not limited to bleeding, infection, damage to intraabdominal organs, hysterectomy, DVT/PE.   Nichole Jackson,Nichole H. 07/27/2011, 12:17 PM   Addendum:  Spoke to Dr. Daiva Eves with ID.  Recommends Vanc for MRSA and to give Anceph for gram neg coverage.

## 2011-07-27 NOTE — Anesthesia Procedure Notes (Signed)
Spinal  Patient location during procedure: OR Preanesthetic Checklist Completed: patient identified, site marked, surgical consent, pre-op evaluation, timeout performed, IV checked, risks and benefits discussed and monitors and equipment checked Spinal Block Patient position: sitting Prep: DuraPrep Patient monitoring: cardiac monitor, continuous pulse ox, blood pressure and heart rate Approach: midline Location: L3-4 Injection technique: catheter Needle Needle type: Tuohy and Sprotte  Needle gauge: 24 G Needle length: 12.7 cm Needle insertion depth: 7 cm Catheter type: closed end flexible Catheter size: 19 g Catheter at skin depth: 14 cm Additional Notes Spinal Dosage in OR  Bupivicaine ml       1.6 PFMS04   mcg        150 Fentanyl mcg            25

## 2011-07-27 NOTE — Anesthesia Postprocedure Evaluation (Signed)
  Anesthesia Post-op Note  Patient: Nichole Jackson  Procedure(s) Performed:  CESAREAN SECTION - Previous classical   Patient is awake, responsive, moving her legs, and has signs of resolution of her numbness. Pain and nausea are reasonably well controlled. Vital signs are stable and clinically acceptable. Oxygen saturation is clinically acceptable. There are no apparent anesthetic complications at this time. Patient is ready for discharge.

## 2011-07-27 NOTE — Op Note (Signed)
Jen Mow PROCEDURE DATE: 07/27/2011  PREOPERATIVE DIAGNOSIS: Intrauterine pregnancy at  [redacted] weeks gestation with history of classical cesarean section for repeat cesarean section POSTOPERATIVE DIAGNOSIS: The same  PROCEDURE:    Low Transverse Cesarean Section  SURGEON:  Dr. Elsie Lincoln  ASSISTANT: Dr. Nicholaus Bloom  INDICATIONS: Tahj Lindseth is a 30 y.o. [id number] at [redacted]w[redacted]d scheduled for cesarean section secondary to prior classical section.  The risks of cesarean section discussed with the patient included but were not limited to: bleeding which may require transfusion or reoperation; infection which may require antibiotics; injury to bowel, bladder, ureters or other surrounding organs; injury to the fetus; need for additional procedures including hysterectomy in the event of a life-threatening hemorrhage; placental abnormalities wth subsequent pregnancies, incisional problems, thromboembolic phenomenon and other postoperative/anesthesia complications. The patient concurred with the proposed plan, giving informed written consent for the procedure.    FINDINGS:  Viable female infant in cephalic presentation clear amniotic fluid.  Intact placenta, three vessel cord.  Grossly normal uterus, ovaries and fallopian tubes. .   ANESTHESIA:    Combined spinal epidural ESTIMATED BLOOD LOSS: 800 ml SPECIMENS: Placenta sent to L&D COMPLICATIONS: None immediate  PROCEDURE IN DETAIL:  The patient received intravenous antibiotics and had sequential compression devices applied to her lower extremities while in the preoperative area.  She was then taken to the operating room where spinal anesthesia was administered (epidural anesthesia was dosed up to surgical level) and was found to be adequate. She was then placed in a dorsal supine position with a leftward tilt, and prepped and draped in a sterile manner.  A foley catheter was placed into her bladder and attached to constant gravity.  After  an adequate timeout was performed, a vertical skin incision was made with scalpel and carried through to the underlying layer of fascia. The fascia was incised in the midline and this incision was extended vertically using the Mayo scissors. Copious adhesions were encountered.  There was adhesions from the uterine wall to the peritoneum, rectus muscles, and fascia on  he left side of the incision.  Thre was no room to get the infant out with out cutting the rectus muscles and fascia on the left.  This was done and access to the lower uterine segment was possible.  The bladder flap was created.  The lower uterine incision was incised with the scalpel and the incision was extended the bandage scissors.  The infant was successfully delivered, and cord was clamped and cut and infant was handed over to awaiting neonatology team. Uterine massage was then administered and the placenta delivered intact with three-vessel cord. The uterus was cleared of clot and debris.  The hysterotomy was closed with 0 vicryl.  The horizontal fascial incision was closed with 0-vicryl.  All layers were hemostatic.  The vertical fascial incision was closed with loop 0 PDS with good restoration of anatomy.  The subcutaneus tissue was copiously irrigated.  The subcutaneous tissue was closed with 0 plain.   The skin was closed with staples.  Pt tolerated the procedure will.  All counts were correct x2.  Pt went to the recovery room in stable condition.

## 2011-07-28 ENCOUNTER — Encounter (HOSPITAL_COMMUNITY): Payer: Self-pay | Admitting: *Deleted

## 2011-07-28 LAB — CBC
HCT: 33.9 % — ABNORMAL LOW (ref 36.0–46.0)
MCH: 29.6 pg (ref 26.0–34.0)
MCHC: 33 g/dL (ref 30.0–36.0)
MCV: 89.7 fL (ref 78.0–100.0)
RDW: 13.9 % (ref 11.5–15.5)

## 2011-07-28 MED ORDER — CHLORHEXIDINE GLUCONATE CLOTH 2 % EX PADS: 6.0000 | MEDICATED_PAD | Freq: Every day | CUTANEOUS | Status: DC

## 2011-07-28 MED ORDER — BACITRACIN 500 UNIT/GM EX OINT: 1.0000 "application " | TOPICAL_OINTMENT | Freq: Two times a day (BID) | CUTANEOUS | Status: DC

## 2011-07-28 MED ORDER — MUPIROCIN 2 % EX OINT
TOPICAL_OINTMENT | Freq: Two times a day (BID) | CUTANEOUS | Status: DC
  Administered 2011-07-28: 22:00:00 via NASAL
  Filled 2011-07-28: qty 22

## 2011-07-28 NOTE — Anesthesia Postprocedure Evaluation (Signed)
  Anesthesia Post-op Note  Patient: Nichole Jackson  Procedure(s) Performed:  CESAREAN SECTION - Previous classical  Patient Location: [facility]  Anesthesia Type: Spinal  Level of Consciousness: awake, alert  and oriented  Airway and Oxygen Therapy: Patient Spontanous Breathing  Post-op Pain: none  Post-op Assessment: Post-op Vital signs reviewed and Patient's Cardiovascular Status Stable  Post-op Vital Signs: Reviewed and stable  Complications: No apparent anesthesia complications

## 2011-07-28 NOTE — Progress Notes (Signed)
UR Chart review completed.  

## 2011-07-28 NOTE — Progress Notes (Signed)
Post Partum Day 1 Subjective: no complaints, up ad lib, voiding, tolerating PO and + flatus  Objective: Blood pressure 118/70, pulse 73, temperature 98.2 F (36.8 C), temperature source Oral, resp. rate 20, weight 92.987 kg (205 lb), last menstrual period 10/05/2010, SpO2 99.00%, unknown if currently breastfeeding.  Physical Exam:  General: alert, cooperative and no distress Lochia: appropriate Uterine Fundus: firm Incision: healing well DVT Evaluation: No cords or calf tenderness. No significant calf/ankle edema.   Basename 07/28/11 0525 07/25/11 1059  HGB 11.2* 13.2  HCT 33.9* 39.8    Assessment/Plan: Plan for discharge on POD3.     LOS: 1 day   Levada Schilling 07/28/2011, 10:34 AM

## 2011-07-28 NOTE — Addendum Note (Signed)
Addendum  created 07/28/11 0817 by Madison Hickman   Modules edited:Notes Section

## 2011-07-29 MED ORDER — OXYCODONE-ACETAMINOPHEN 5-325 MG PO TABS: 1.0000 | ORAL_TABLET | ORAL | Status: AC | PRN

## 2011-07-29 MED ORDER — IBUPROFEN 600 MG PO TABS: 600.0000 mg | ORAL_TABLET | Freq: Four times a day (QID) | ORAL | Status: AC

## 2011-07-29 NOTE — Discharge Summary (Signed)
Obstetric Discharge Summary Reason for Admission: cesarean section, repeat at [redacted] weeks gestation due to prior vertical extension of the previous incision on the uterus Prenatal Procedures: ultrasound Intrapartum Procedures: cesarean: low cervical, transverse Postpartum Procedures: none and Of note the baby required intubation and transfer to the neonatal ICU. At the time of discharge of the mother the baby remains intubated and has reports surfactant x3 however oxygen requirements are acceptable at 33% baby is considered to be recovering. Complications-Operative and Postpartum: none Hemoglobin  Date Value Range Status  07/28/2011 11.2* 12.0-15.0 (g/dL) Final     HCT  Date Value Range Status  07/28/2011 33.9* 36.0-46.0 (%) Final    Discharge Diagnoses: Term Pregnancy-delivered and Prior vertical (classical) uterine incision extension from prior cesarean sec  Discharge Information: Date: 07/29/2011 Activity: Gradual increase activity topical Neosporin to incision, home health (baby lovel) to visit patient in one week to remove staples Diet: routine Medications: PNV, Ibuprofen and Percocet Condition: stable Instructions: Cesarean section instructions in Spanish placed on epic discharge instructions Discharge to: home   Newborn Data: Live born female  Birth Weight: 6 lb 0.5 oz (2735 g) APGAR: 8, 9  Home with Baby remains in neonatal ICU at present.  Nichole Jackson 07/29/2011, 7:31 AM

## 2011-07-29 NOTE — Progress Notes (Signed)
Post Partum Day 2 Subjective: tolerating PO, + flatus and Requiring oral pain medications in talking to the nursery I found that the baby remains on the ventilator and has required 3 doses of surfactant. Oxygen concentrations remain acceptable at 33% for the baby  Objective: Blood pressure 110/67, pulse 64, temperature 97.2 F (36.2 C), temperature source Oral, resp. rate 18, height 5\' 4"  (1.626 m), weight 92.987 kg (205 lb), last menstrual period 10/05/2010, SpO2 99.00%, unknown if currently breastfeeding.  Physical Exam:  General: alert, cooperative and no distress Lochia: inappropriate Uterine Fundus: soft Incision: healing well, midline vertical lower abdominal incision with staples in place. There is some bruising around the incision but no suspicion of hematoma no erythema or local heat DVT Evaluation: No evidence of DVT seen on physical exam.   Basename 07/28/11 0525  HGB 11.2*  HCT 33.9*    Assessment/Plan: Postop day 2 status post repeat cesarean section at 37 weeks due to history of prior T-type uterine incision extending into the myometrium Discharge home and Breastfeeding   LOS: 2 days   Joretta Eads V 07/29/2011, 7:04 AM

## 2011-07-29 NOTE — Progress Notes (Signed)
Pt d/c home with FOB via ambulatory pt to NICU before leaving hospital.. Prescriptions and d/c instructions reviewed.

## 2011-08-02 ENCOUNTER — Encounter (HOSPITAL_COMMUNITY): Payer: Self-pay | Admitting: Obstetrics & Gynecology

## 2014-05-19 ENCOUNTER — Ambulatory Visit: Payer: Medicaid Other | Attending: Internal Medicine

## 2014-06-08 ENCOUNTER — Encounter (HOSPITAL_COMMUNITY): Payer: Self-pay | Admitting: Obstetrics & Gynecology

## 2014-09-09 ENCOUNTER — Ambulatory Visit: Payer: Self-pay | Attending: Family Medicine | Admitting: Internal Medicine

## 2014-09-09 ENCOUNTER — Encounter: Payer: Self-pay | Admitting: Internal Medicine

## 2014-09-09 VITALS — BP 119/78 | HR 67 | Temp 98.7°F | Resp 16 | Wt 191.8 lb

## 2014-09-09 DIAGNOSIS — Z79899 Other long term (current) drug therapy: Secondary | ICD-10-CM | POA: Insufficient documentation

## 2014-09-09 DIAGNOSIS — K029 Dental caries, unspecified: Secondary | ICD-10-CM | POA: Insufficient documentation

## 2014-09-09 DIAGNOSIS — Z139 Encounter for screening, unspecified: Secondary | ICD-10-CM | POA: Insufficient documentation

## 2014-09-09 DIAGNOSIS — Z833 Family history of diabetes mellitus: Secondary | ICD-10-CM | POA: Insufficient documentation

## 2014-09-09 DIAGNOSIS — N189 Chronic kidney disease, unspecified: Secondary | ICD-10-CM | POA: Insufficient documentation

## 2014-09-09 LAB — CBC WITH DIFFERENTIAL/PLATELET
BASOS PCT: 1 % (ref 0–1)
Basophils Absolute: 0.1 10*3/uL (ref 0.0–0.1)
EOS ABS: 0.3 10*3/uL (ref 0.0–0.7)
EOS PCT: 3 % (ref 0–5)
HEMATOCRIT: 41.3 % (ref 36.0–46.0)
Hemoglobin: 13.8 g/dL (ref 12.0–15.0)
Lymphocytes Relative: 22 % (ref 12–46)
Lymphs Abs: 2 10*3/uL (ref 0.7–4.0)
MCH: 29 pg (ref 26.0–34.0)
MCHC: 33.4 g/dL (ref 30.0–36.0)
MCV: 86.8 fL (ref 78.0–100.0)
MONO ABS: 0.4 10*3/uL (ref 0.1–1.0)
MPV: 12.6 fL — ABNORMAL HIGH (ref 8.6–12.4)
Monocytes Relative: 5 % (ref 3–12)
NEUTROS PCT: 69 % (ref 43–77)
Neutro Abs: 6.1 10*3/uL (ref 1.7–7.7)
Platelets: 217 10*3/uL (ref 150–400)
RBC: 4.76 MIL/uL (ref 3.87–5.11)
RDW: 13.6 % (ref 11.5–15.5)
WBC: 8.9 10*3/uL (ref 4.0–10.5)

## 2014-09-09 LAB — COMPLETE METABOLIC PANEL WITH GFR
ALT: 8 U/L (ref 0–35)
AST: 14 U/L (ref 0–37)
Albumin: 3.5 g/dL (ref 3.5–5.2)
Alkaline Phosphatase: 59 U/L (ref 39–117)
BUN: 10 mg/dL (ref 6–23)
CALCIUM: 8.6 mg/dL (ref 8.4–10.5)
CO2: 24 mEq/L (ref 19–32)
Chloride: 105 mEq/L (ref 96–112)
Creat: 0.51 mg/dL (ref 0.50–1.10)
GFR, Est African American: >89 mL/min
GFR, Est Non African American: >89 mL/min
GLUCOSE: 84 mg/dL (ref 70–99)
Potassium: 4.1 mEq/L (ref 3.5–5.3)
Sodium: 139 mEq/L (ref 135–145)
Total Bilirubin: 0.5 mg/dL (ref 0.2–1.2)
Total Protein: 6.7 g/dL (ref 6.0–8.3)

## 2014-09-09 LAB — LIPID PANEL
CHOL/HDL RATIO: 4.4 ratio
Cholesterol: 203 mg/dL — ABNORMAL HIGH (ref 0–200)
HDL: 46 mg/dL (ref >39)
LDL CALC: 115 mg/dL — AB (ref 0–99)
TRIGLYCERIDES: 212 mg/dL — AB (ref <150)
VLDL: 42 mg/dL — AB (ref 0–40)

## 2014-09-09 LAB — HEMOGLOBIN A1C
Hgb A1c MFr Bld: 5.5 % (ref <5.7)
Mean Plasma Glucose: 111 mg/dL (ref <117)

## 2014-09-09 LAB — TSH: TSH: 2.271 u[IU]/mL (ref 0.350–4.500)

## 2014-09-09 NOTE — Progress Notes (Signed)
Patient Demographics  Nichole Jackson, is a 34 y.o. female  ZOX:[id number]  WJX:[id number]  DOB - May 29, 1981  CC:  Chief Complaint  Patient presents with  . Establish Care       HPI: Nichole Jackson is a 34 y.o. female here today to establish medical care.patient is requesting referral to see a dentist she reports lot of dental cavities, denies any fever chills sore throat, she denies any other acute symptoms. Patient does report family history of diabetes. Patient has No headache, No chest pain, No abdominal pain - No Nausea, No new weakness tingling or numbness, No Cough - SOB.  No Known Allergies Past Medical History  Diagnosis Date  . Chronic kidney disease     stones  . No pertinent past medical history    Current Outpatient Prescriptions on File Prior to Visit  Medication Sig Dispense Refill  . hydrOXYzine (VISTARIL) 25 MG capsule Take 25 mg by mouth at bedtime as needed. For headache.     . Prenatal Vit-Fe Fumarate-FA (PRENATAL MULTIVITAMIN) TABS Take 1 tablet by mouth daily.       No current facility-administered medications on file prior to visit.   Family History  Problem Relation Age of Onset  . Hypertension Mother   . Diabetes Mother   . Hypertension Maternal Grandmother   . Diabetes Maternal Grandmother   . Heart disease Maternal Grandmother    History   Social History  . Marital Status: Married    Spouse Name: N/A    Number of Children: N/A  . Years of Education: N/A   Occupational History  . Not on file.   Social History Main Topics  . Smoking status: Never Smoker   . Smokeless tobacco: Not on file  . Alcohol Use: No  . Drug Use: Not on file  . Sexual Activity: Not on file   Other Topics Concern  . Not on file   Social History Narrative    Review of Systems: Constitutional: Negative for fever, chills, diaphoresis, activity change, appetite change and fatigue. HENT: Negative for ear pain, nosebleeds, congestion, facial  swelling, rhinorrhea, neck pain, neck stiffness and ear discharge.  Eyes: Negative for pain, discharge, redness, itching and visual disturbance. Respiratory: Negative for cough, choking, chest tightness, shortness of breath, wheezing and stridor.  Cardiovascular: Negative for chest pain, palpitations and leg swelling. Gastrointestinal: Negative for abdominal distention. Genitourinary: Negative for dysuria, urgency, frequency, hematuria, flank pain, decreased urine volume, difficulty urinating and dyspareunia.  Musculoskeletal: Negative for back pain, joint swelling, arthralgia and gait problem. Neurological: Negative for dizziness, tremors, seizures, syncope, facial asymmetry, speech difficulty, weakness, light-headedness, numbness and headaches.  Hematological: Negative for adenopathy. Does not bruise/bleed easily. Psychiatric/Behavioral: Negative for hallucinations, behavioral problems, confusion, dysphoric mood, decreased concentration and agitation.    Objective:   Filed Vitals:   09/09/14 1123  BP: 119/78  Pulse: 67  Temp: 98.7 F (37.1 C)  Resp: 16    Physical Exam: Constitutional: Patient appears well-developed and well-nourished. No distress. HENT: Normocephalic, atraumatic, External right and left ear normal. Oropharynx is clear and moist.  Eyes: Conjunctivae and EOM are normal. PERRLA, no scleral icterus. Neck: Normal ROM. Neck supple. No JVD. No tracheal deviation. No thyromegaly. CVS: RRR, S1/S2 +, no murmurs, no gallops, no carotid bruit.  Pulmonary: Effort and breath sounds normal, no stridor, rhonchi, wheezes, rales.  Abdominal: Soft. BS +, no distension, tenderness, rebound or guarding.  Musculoskeletal: Normal range of motion. No edema and no tenderness.  Neuro:  Alert. Normal reflexes, muscle tone coordination. No cranial nerve deficit. Skin: Skin is warm and dry. No rash noted. Not diaphoretic. No erythema. No pallor. Psychiatric: Normal mood and affect. Behavior,  judgment, thought content normal.  Lab Results  Component Value Date   WBC 10.2 07/28/2011   HGB 11.2* 07/28/2011   HCT 33.9* 07/28/2011   MCV 89.7 07/28/2011   PLT 132* 07/28/2011   Lab Results  Component Value Date   CREATININE 0.57 08/11/2007   BUN 2* 08/11/2007   NA 137 08/11/2007   K 3.5 08/11/2007   CL 102 08/11/2007   CO2 29 08/11/2007    No results found for: HGBA1C Lipid Panel  No results found for: CHOL, TRIG, HDL, CHOLHDL, VLDL, LDLCALC     Assessment and plan:   1. Family history of diabetes mellitus (DM)  - Hemoglobin A1c  2. Screening Ordered baseline blood work. - CBC with Differential/Platelet - COMPLETE METABOLIC PANEL WITH GFR - TSH - Lipid panel - Vit D  25 hydroxy (rtn osteoporosis monitoring)  3. Dental cavities  - Ambulatory referral to Dentistry     Health Maintenance  -Pap Smear: uptodate   -Vaccinations: uptodate with flu shot as per pt [facility]   Return in about 4 months (around 01/08/2015), or if symptoms worsen or fail to improve.   Doris CheadleADVANI, Shontelle Muska, MD

## 2014-09-09 NOTE — Progress Notes (Signed)
Patient here with interpreter Patient here to establish care Takes no prescribed medications Having some dental issues and requesting a referral to dentist

## 2014-09-10 LAB — VITAMIN D 25 HYDROXY (VIT D DEFICIENCY, FRACTURES): Vit D, 25-Hydroxy: 10 ng/mL — ABNORMAL LOW (ref 30–100)

## 2014-09-17 ENCOUNTER — Telehealth: Payer: Self-pay

## 2014-09-17 MED ORDER — VITAMIN D (ERGOCALCIFEROL) 1.25 MG (50000 UNIT) PO CAPS: 50000.0000 [IU] | ORAL_CAPSULE | ORAL | Status: DC

## 2014-09-17 NOTE — Telephone Encounter (Signed)
-----   Message from Doris Cheadleeepak Advani, MD sent at 09/10/2014 11:34 AM EST ----- Blood work reviewed, noticed low vitamin D, call patient advise to start ergocalciferol 50,000 units once a week for the duration of  12 weeks. Also noticed elevated cholesterol, advise patient for low fat diet.

## 2014-09-17 NOTE — Telephone Encounter (Signed)
Interpreter line used-Sergio [phone] Patient is aware of her lab results Prescription sent to [facility] on [address]

## 2015-11-12 ENCOUNTER — Ambulatory Visit (INDEPENDENT_AMBULATORY_CARE_PROVIDER_SITE_OTHER): Payer: Self-pay | Admitting: Internal Medicine

## 2015-11-12 ENCOUNTER — Encounter: Payer: Self-pay | Admitting: Internal Medicine

## 2015-11-12 VITALS — BP 159/95 | HR 86 | Temp 98.2°F | Ht 65.0 in | Wt 184.9 lb

## 2015-11-12 DIAGNOSIS — E569 Vitamin deficiency, unspecified: Secondary | ICD-10-CM

## 2015-11-12 DIAGNOSIS — E559 Vitamin D deficiency, unspecified: Secondary | ICD-10-CM

## 2015-11-12 DIAGNOSIS — Z0000 Encounter for general adult medical examination without abnormal findings: Secondary | ICD-10-CM | POA: Insufficient documentation

## 2015-11-12 DIAGNOSIS — R519 Headache, unspecified: Secondary | ICD-10-CM

## 2015-11-12 DIAGNOSIS — R51 Headache: Secondary | ICD-10-CM

## 2015-11-12 NOTE — Assessment & Plan Note (Signed)
States she [facility] pap [facility] which was normal. Will request records, otherwise just repeat pap in 3 years.

## 2015-11-12 NOTE — Assessment & Plan Note (Addendum)
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 Also could be due to sleep apnea as she is obese and has a lg neck circumference.   - ordered sleep study - referred for eye exam - advised weight loss - elevated BP could be contributing but not high enough that you would expect this.  - could also be NPH since she is obese or PCOS but she has 3 kids and denies urinary issues. a1c normal last year. Can consider MRI in the future for further eval if sleep study negative and BP comes down w/ diet/exercise.  - f/u in 6 months to reassess HAs and repeat BP

## 2015-11-12 NOTE — Patient Instructions (Signed)
Hipertensin (Hypertension) La hipertensin, conocida comnmente como presin arterial alta, se produce cuando la sangre bombea en las arterias con mucha fuerza. Las arterias son los vasos sanguneos que transportan la sangre desde el corazn hacia todas las partes del cuerpo. Una lectura de la presin arterial consiste en un nmero ms alto sobre un nmero ms bajo, por ejemplo, 110/72. El nmero ms alto (presin sistlica) corresponde a la presin interna de las arterias cuando el corazn bombea sangre. El nmero ms bajo (presin diastlica) corresponde a la presin interna de las arterias cuando el corazn se relaja. En condiciones ideales, la presin arterial debe ser inferior a 120/80. La hipertensin fuerza al corazn a trabajar ms para bombear la sangre. Las arterias pueden estrecharse o ponerse rgidas. La hipertensin no tratada o no controlada puede causar infarto de miocardio, ictus, enfermedad renal y otros problemas. FACTORES DE RIESGO Algunos factores de riesgo de hipertensin son controlables, pero otros no lo son.  Entre los factores de riesgo que usted no puede controlar, se incluyen los siguientes:   La raza. El riesgo es mayor para las personas afroamericanas.  La edad. Los riesgos aumentan con la edad.  El sexo. Antes de los 45aos, los hombres corren ms riesgo que las mujeres. Despus de los 65aos, las mujeres corren ms riesgo que los hombres. Entre los factores de riesgo que usted puede controlar, se incluyen los siguientes:  No hacer la cantidad suficiente de actividad fsica o ejercicio.  Tener sobrepeso.  Consumir mucha grasa, azcar, caloras o sal en la dieta.  Beber alcohol en exceso. SIGNOS Y SNTOMAS Por lo general, la hipertensin no causa signos o sntomas. La hipertensin arterial demasiado alta (crisis hipertensiva) puede causar dolor de cabeza, ansiedad, falta de aire y hemorragia nasal. DIAGNSTICO Para detectar si usted tiene hipertensin, el  mdico le medir la presin arterial mientras est sentado, con el brazo levantado a la altura del corazn. Debe medirla al menos dos veces en el mismo brazo. Determinadas condiciones pueden causar una diferencia de presin arterial entre el brazo izquierdo y el derecho. El hecho de tener una sola lectura de la presin arterial ms alta que lo normal no significa que necesita un tratamiento. Si no est claro si tiene hipertensin arterial, es posible que se le pida que regrese otro da para volver a controlarle la presin arterial. O bien se le puede pedir que se controle la presin arterial en su casa durante 1 o ms meses. TRATAMIENTO El tratamiento de la hipertensin arterial incluye hacer cambios en el estilo de vida y, posiblemente, tomar medicamentos. Un estilo de vida saludable puede ayudar a bajar la presin arterial alta. Quiz deba cambiar algunos hbitos. Los cambios en el estilo de vida pueden incluir lo siguiente:  Seguir la dieta DASH. Esta dieta tiene un alto contenido de frutas, verduras y cereales integrales. Incluye poca cantidad de sal, carnes rojas y azcares agregados.  Mantenga el consumo de sodio por debajo de 2300 mg por da.  Realizar al menos entre 30 y 45 minutos de ejercicio aerbico, 4 veces por semana como mnimo.  Perder peso, si es necesario.  No fumar.  Limitar el consumo de bebidas alcohlicas.  Aprender formas de reducir el estrs. El mdico puede recetarle medicamentos si los cambios en el estilo de vida no son suficientes para lograr controlar la presin arterial y si una de las siguientes afirmaciones es verdadera:  Tiene entre 18 y 59 aos y su presin arterial sistlica est por encima de 140.  Tiene   60 aos o ms y su presin arterial sistlica est por encima de 150.  Su presin arterial diastlica est por encima de 90.  Tiene diabetes y su presin arterial sistlica est por encima de 140 o su presin arterial diastlica est por encima de  90.  Tiene una enfermedad renal y su presin arterial est por encima de 140/90.  Tiene una enfermedad cardaca y su presin arterial est por encima de 140/90. La presin arterial deseada puede variar en funcin de las enfermedades, la edad y otros factores personales. INSTRUCCIONES PARA EL CUIDADO EN EL HOGAR  Haga que le midan de nuevo la presin arterial segn las indicaciones del mdico.  Tome los medicamentos solamente como se lo haya indicado el mdico. Siga cuidadosamente las indicaciones. Los medicamentos para la presin arterial deben tomarse segn las indicaciones. Los medicamentos pierden eficacia al omitir las dosis. El hecho de omitir las dosis tambin aumenta el riesgo de otros problemas.  No fume.  Contrlese la presin arterial en su casa segn las indicaciones del mdico. SOLICITE ATENCIN MDICA SI:   Piensa que tiene una reaccin alrgica a los medicamentos.  Tiene mareos o dolores de cabeza con recurrencia.  Tiene hinchazn en los tobillos.  Tiene problemas de visin. SOLICITE ATENCIN MDICA DE INMEDIATO SI:  Siente un dolor de cabeza intenso o confusin.  Siente debilidad inusual, adormecimiento o que se desmayar.  Siente dolor intenso en el pecho o en el abdomen.  Vomita repetidas veces.  Tiene dificultad para respirar. ASEGRESE DE QUE:   Comprende estas instrucciones.  Controlar su afeccin.  Recibir ayuda de inmediato si no mejora o si empeora.   Esta informacin no tiene como fin reemplazar el consejo del mdico. Asegrese de hacerle al mdico cualquier pregunta que tenga.   Document Released: 07/24/2005 Document Revised: 12/08/2014 Elsevier Interactive Patient Education 2016 [employer]  

## 2015-11-12 NOTE — Progress Notes (Signed)
   Subjective:   Patient ID: Nichole Jackson female   DOB: Jan 31, 1981 34 y.o.   MRN: [id number]  HPI: Nichole Jackson is a 35 y.o. with past medical history as outlined below who presents to clinic to establish care. She is also complaining of headaches x 5 years. HAs are located on frontal forehead, b/l temples, back of her eyes, and back of her head. She has blurry vision and has not had an eye exam in a while. She does not work and is not in front of an computer screen for prolonged amt of time. She does not think she snores at night. Endorses poor sleep. Has 3 health kids. Denies n/v, abd pain, CP, SOB, dysuria, urinary hestitancy or frequency. She does not smoke or drink EtOH. She has been dx w/ HTN in the past and previously was on anti-htn meds but is not on any meds now.   Her grandmother has a hx of heart disease,she does not know what age she was dx w/ this.   Has 3 healthy kids.   Please see problem list for status of the pt's chronic medical problems.  Past Medical History  Diagnosis Date  . Chronic kidney disease     stones  . No pertinent past medical history    No current outpatient prescriptions on file.   No current facility-administered medications for this visit.   Family History  Problem Relation Age of Onset  . Hypertension Mother   . Diabetes Mother   . Hypertension Maternal Grandmother   . Diabetes Maternal Grandmother   . Heart disease Maternal Grandmother    Social History   Social History  . Marital Status: Married    Spouse Name: N/A  . Number of Children: N/A  . Years of Education: N/A   Social History Main Topics  . Smoking status: Never Smoker   . Smokeless tobacco: None  . Alcohol Use: No  . Drug Use: None  . Sexual Activity: Not Asked   Other Topics Concern  . None   Social History Narrative   Review of Systems: Pertinent ROS noted in HPI, all other systems negative.  Objective:  Physical Exam: Filed Vitals:   11/12/15 1011  BP: 159/95  Pulse: 86  Temp: 98.2 F (36.8 C)  TempSrc: Oral  Height: 5\' 5"  (1.651 m)  Weight: 184 lb 14.4 oz (83.87 kg)  SpO2: 100%   Physical Exam  Constitutional: She appears well-developed and well-nourished. No distress.  HENT:  Head: Normocephalic.  Nose: Nose normal.  Mouth/Throat: Oropharynx is clear and moist. No oropharyngeal exudate.  Eyes: Conjunctivae and EOM are normal. Pupils are equal, round, and reactive to light. No scleral icterus.  Neck:  lg neck circumference   Cardiovascular: Normal rate, regular rhythm and normal heart sounds.  Exam reveals no gallop and no friction rub.   No murmur heard. Pulmonary/Chest: Effort normal and breath sounds normal. No respiratory distress. She has no wheezes. She has no rales.  Abdominal: Soft. Bowel sounds are normal. She exhibits no distension. There is no tenderness. There is no rebound and no guarding.  Skin: Skin is warm and dry. No rash noted. She is not diaphoretic. No erythema. No pallor.   Assessment & Plan:   Please see problem based assessment and plan.

## 2015-11-12 NOTE — Assessment & Plan Note (Signed)
Vit D 10 last year. Denies being on vit d 50,000 units q7 days even though it was on her med list.   - checking vit d today if low will start on course of vit d 50,000

## 2015-11-16 LAB — VITAMIN D 1,25 DIHYDROXY
VITAMIN D3 1, 25 (OH): 72 pg/mL
Vitamin D 1, 25 (OH)2 Total: 75 pg/mL
Vitamin D2 1, 25 (OH)2: <10 pg/mL

## 2015-11-16 NOTE — Addendum Note (Signed)
Addended by: Denton BrickRUONG, Diona Peregoy M on: 11/16/2015 09:16 AM   Modules accepted: Orders, SmartSet

## 2015-11-16 NOTE — Addendum Note (Signed)
Addended by: Neomia DearPOWERS, Levetta Bognar E on: 11/16/2015 07:20 AM   Modules accepted: Orders

## 2015-11-17 ENCOUNTER — Emergency Department (HOSPITAL_COMMUNITY)
Admission: EM | Admit: 2015-11-17 | Discharge: 2015-11-18 | Disposition: A | Discharge status: Home / Self Care | Payer: Self-pay | Attending: Emergency Medicine | Admitting: Emergency Medicine

## 2015-11-17 ENCOUNTER — Encounter (HOSPITAL_COMMUNITY): Payer: Self-pay | Admitting: Emergency Medicine

## 2015-11-17 ENCOUNTER — Emergency Department (HOSPITAL_COMMUNITY): Payer: Self-pay

## 2015-11-17 DIAGNOSIS — R0602 Shortness of breath: Secondary | ICD-10-CM | POA: Insufficient documentation

## 2015-11-17 DIAGNOSIS — R42 Dizziness and giddiness: Secondary | ICD-10-CM | POA: Insufficient documentation

## 2015-11-17 DIAGNOSIS — N189 Chronic kidney disease, unspecified: Secondary | ICD-10-CM | POA: Insufficient documentation

## 2015-11-17 DIAGNOSIS — R002 Palpitations: Secondary | ICD-10-CM | POA: Insufficient documentation

## 2015-11-17 DIAGNOSIS — R079 Chest pain, unspecified: Secondary | ICD-10-CM | POA: Insufficient documentation

## 2015-11-17 DIAGNOSIS — R51 Headache: Secondary | ICD-10-CM | POA: Insufficient documentation

## 2015-11-17 DIAGNOSIS — Z8639 Personal history of other endocrine, nutritional and metabolic disease: Secondary | ICD-10-CM | POA: Insufficient documentation

## 2015-11-17 DIAGNOSIS — I129 Hypertensive chronic kidney disease with stage 1 through stage 4 chronic kidney disease, or unspecified chronic kidney disease: Secondary | ICD-10-CM | POA: Insufficient documentation

## 2015-11-17 HISTORY — DX: Essential (primary) hypertension: I10

## 2015-11-17 HISTORY — DX: Hyperlipidemia, unspecified: E78.5

## 2015-11-17 LAB — CBC
HEMATOCRIT: 41.9 % (ref 36.0–46.0)
Hemoglobin: 13.6 g/dL (ref 12.0–15.0)
MCH: 28.7 pg (ref 26.0–34.0)
MCHC: 32.5 g/dL (ref 30.0–36.0)
MCV: 88.4 fL (ref 78.0–100.0)
PLATELETS: 230 10*3/uL (ref 150–400)
RBC: 4.74 MIL/uL (ref 3.87–5.11)
RDW: 12.9 % (ref 11.5–15.5)
WBC: 8.8 10*3/uL (ref 4.0–10.5)

## 2015-11-17 LAB — BASIC METABOLIC PANEL
Anion gap: 11 (ref 5–15)
BUN: 6 mg/dL (ref 6–20)
CHLORIDE: 108 mmol/L (ref 101–111)
CO2: 21 mmol/L — ABNORMAL LOW (ref 22–32)
Calcium: 9.1 mg/dL (ref 8.9–10.3)
Creatinine, Ser: 0.55 mg/dL (ref 0.44–1.00)
Glucose, Bld: 156 mg/dL — ABNORMAL HIGH (ref 65–99)
POTASSIUM: 3 mmol/L — AB (ref 3.5–5.1)
SODIUM: 140 mmol/L (ref 135–145)

## 2015-11-17 LAB — I-STAT TROPONIN, ED: Troponin i, poc: 0 ng/mL (ref 0.00–0.08)

## 2015-11-17 NOTE — ED Notes (Signed)
The patient has been having intermittent chest pain for five days.  She denies any other symptoms other than chills. She was seen at the clinic downstairs for hypertension but was not prescribed anything.  She rates her pain 7/10.

## 2015-11-18 LAB — I-STAT TROPONIN, ED: TROPONIN I, POC: 0.01 ng/mL (ref 0.00–0.08)

## 2015-11-18 MED ORDER — POTASSIUM CHLORIDE CRYS ER 20 MEQ PO TBCR
40.0000 meq | EXTENDED_RELEASE_TABLET | Freq: Once | ORAL | Status: AC
  Administered 2015-11-18: 40 meq via ORAL
  Filled 2015-11-18: qty 2

## 2015-11-18 MED ORDER — KETOROLAC TROMETHAMINE 30 MG/ML IJ SOLN
30.0000 mg | Freq: Once | INTRAMUSCULAR | Status: AC
  Administered 2015-11-18: 30 mg via INTRAVENOUS
  Filled 2015-11-18: qty 1

## 2015-11-18 MED ORDER — SODIUM CHLORIDE 0.9 % IV BOLUS (SEPSIS)
1000.0000 mL | Freq: Once | INTRAVENOUS | Status: AC
  Administered 2015-11-18: 1000 mL via INTRAVENOUS

## 2015-11-18 NOTE — ED Notes (Signed)
Pt verbalized understanding of discharge instructions and follow-up care. Vital signs stable at discharge. 

## 2015-11-18 NOTE — ED Notes (Signed)
Pt denies chest pain at this time.

## 2015-11-18 NOTE — ED Provider Notes (Signed)
CSN: [id number]     Arrival date & time 11/17/15  2041 History  By signing my name below, I, Nichole Jackson, attest that this documentation has been prepared under the direction and in the presence of Rolan BuccoMelanie Chantavia Bazzle, MD. Electronically Signed: Bethel BornBritney Jackson, ED Scribe. 11/18/2015. 4:14 AM   Chief Complaint  Patient presents with  . Chest Pain    The patient has been having intermittent chest pain for five days.  She denies any other symptoms other than chills.    The history is provided by the patient. A language interpreter was used [place](Spanish, [phone]).   Nichole Jackson is a 35 y.o. female with history of HTN and HLD who presents to the Emergency Department complaining of intermittent, sharp, 7/10 in severity, left-sided chest pain with onset 2 weeks ago.She is pain free at present. The episodes of pain have been more frequent over the last 2 days and last for 1-5 minutes. She notes that the pian occurs randomly without identifiable trigger or modifying factors.  Associated symptoms include dizziness, palpitations, and SOB when the pain is present. She also has frequent intermittent headaches that are not new and for which she was recently seen by her PCP. Tylenol and Advil have provided insufficient pain relief at home. Pt denies LE pain or swelling, cough, cold, and any other recent illness. She has no known history of cardiac disease or DM. Pt takes no daily medication.   Past Medical History  Diagnosis Date  . Chronic kidney disease     stones  . No pertinent past medical history   . Hypertension   . Hyperlipidemia    Past Surgical History  Procedure Laterality Date  . Cesarean section      x 2  . Cesarean section  07/27/2011    Procedure: CESAREAN SECTION;  Surgeon: Lesly DukesKelly H. Leggett, MD;  Location: [facility];  Service: Gynecology;  Laterality: N/A;  Previous classical   Family History  Problem Relation Age of Onset  . Hypertension Mother   . Diabetes Mother   . Hypertension  Maternal Grandmother   . Diabetes Maternal Grandmother   . Heart disease Maternal Grandmother    Social History  Substance Use Topics  . Smoking status: Never Smoker   . Smokeless tobacco: None  . Alcohol Use: No   OB History    Gravida Para Term Preterm AB TAB SAB Ectopic Multiple Living   3 3 3       3      Review of Systems  Constitutional: Negative for fever.  Respiratory: Positive for shortness of breath. Negative for cough.   Cardiovascular: Positive for chest pain and palpitations. Negative for leg swelling.  Neurological: Positive for dizziness and headaches.  All other systems reviewed and are negative.  Allergies  Review of patient's allergies indicates no known allergies.  Home Medications   Prior to Admission medications   Not on File   BP 143/84 mmHg  Pulse 82  Temp(Src) 98.1 F (36.7 C) (Oral)  Resp 18  SpO2 100% Physical Exam  Constitutional: She is oriented to person, place, and time. She appears well-developed and well-nourished. No distress.  HENT:  Head: Normocephalic and atraumatic.  Eyes: EOM are normal.  Neck: Normal range of motion.  Cardiovascular: Normal rate, regular rhythm and normal heart sounds.   Pulmonary/Chest: Effort normal and breath sounds normal.  Abdominal: Soft. She exhibits no distension. There is no tenderness.  Musculoskeletal: Normal range of motion.  No edema or calf tenderness  Neurological: She is alert and oriented to person, place, and time.  Skin: Skin is warm and dry.  Psychiatric: She has a normal mood and affect. Judgment normal.  Nursing note and vitals reviewed.   ED Course  Procedures (including critical care time) DIAGNOSTIC STUDIES: Oxygen Saturation is 100% on RA,  normal by my interpretation.    COORDINATION OF CARE: 2:52 AM Discussed treatment plan which includes lab work, CXR, EKG, and IVF with pt at bedside and pt agreed to plan.  Labs Review Labs Reviewed  BASIC METABOLIC PANEL - Abnormal;  Notable for the following:    Potassium 3.0 (*)    CO2 21 (*)    Glucose, Bld 156 (*)    All other components within normal limits  CBC  I-STAT TROPOININ, ED  Rosezena Sensor, ED    Imaging Review Dg Chest 2 View  11/17/2015  CLINICAL DATA:  Left-sided chest pain with cardiac palpitations EXAM: CHEST  2 VIEW COMPARISON:  May 14, 2015 FINDINGS: Lungs are clear. Heart size and pulmonary vascularity are normal. No adenopathy. No pneumothorax. No bone lesions. IMPRESSION: No edema or consolidation. Electronically Signed   By: Bretta Bang III M.D.   On: 11/17/2015 21:20   I have personally reviewed and evaluated these images and lab results as part of my medical decision-making.   EKG Interpretation   Date/Time:  Wednesday November 17 2015 20:47:29 EDT Ventricular Rate:  119 PR Interval:  140 QRS Duration: 82 QT Interval:  326 QTC Calculation: 458 R Axis:   65 Text Interpretation:  Sinus tachycardia Otherwise normal ECG No old  tracing to compare Confirmed by Ethelda Chick  MD, SAM [phone]) on 11/17/2015  11:57:48 PM      MDM   Final diagnoses:  Chest pain, unspecified chest pain type    Patient presents with atypical type chest pain. It's nonexertional. There is no pleuritic type pain. No ongoing shortness of breath. No hypoxia. There is no suggestions of pneumonia or pneumothorax. No symptoms that would be more consistent with PE. She's been pain-free in the ED. Her EKG is non-concerning. She's had delta troponin that have been negative. She has a low heart score. There's no family history of early heart disease. I feel she can be discharged for further outpatient follow-up. I encouraged her to follow-up with her PCP at the internal medicine clinic within the next 2-3 days for recheck or return here as needed for any worsening symptoms. I did advise her through the Spanish interpreter that her blood sugar is a little bit elevated and this needs to be followed up by her PCP as  well.  I personally performed the services described in this documentation, which was scribed in my presence.  The recorded information has been reviewed and considered.    Rolan Bucco, MD 11/18/15 [id number]

## 2015-11-18 NOTE — Discharge Instructions (Signed)
Dolor de pecho inespecfico  (Nonspecific Chest Pain) El dolor de pecho puede deberse a muchas enfermedades diferentes. Siempre existe una posibilidad de que el dolor est relacionado con algo grave, como un infarto de miocardio o un cogulo sanguneo en los pulmones. Hay muchas enfermedades que no son potencialmente mortales que pueden causar dolor de pecho. Si tiene dolor de pecho, es muy importante que se controle con el mdico. CAUSAS  Las causas del dolor de pecho pueden ser las siguientes:  Acidez estomacal.  Neumona o bronquitis.  Ansiedad o estrs.  Inflamacin de la zona que rodea al corazn (pericarditis) o a los pulmones (pleuritis o pleuresa).  Un cogulo sanguneo en el pulmn.  Colapso de un pulmn (neumotrax), que puede aparecer de manera repentina por s solo (neumotrax espontneo) o debido a un traumatismo en el trax.  Culebrilla (virus de la varicela zster).  Infarto de miocardio.  Dao de los huesos, los msculos y los cartlagos que conforman la pared torcica. Esto puede incluir lo siguiente:  Hematomas seos debido a lesiones.  Distensiones musculares o de los cartlagos por tos frecuente o repetida, o por exceso de trabajo.  Fractura de una o ms costillas.  Dolor de cartlago debido a inflamacin (costocondritis). FACTORES DE RIESGO  Los factores de riesgo de tener dolor de pecho pueden incluir lo siguiente:  Actividades que incrementan el riesgo de sufrir traumatismos o lesiones en el trax.  Infecciones o enfermedades respiratorias que causan tos frecuente.  Enfermedades o excesos en las comidas que pueden causar acidez.  Enfermedades cardacas o antecedentes familiares de enfermedades cardacas.  Enfermedades o comportamientos de salud que aumentan el riesgo de tener un cogulo sanguneo.  Haber tenido varicela (varicela zster). SIGNOS Y SNTOMAS El dolor de pecho puede provocar las siguientes sensaciones:  Ardor u hormigueo en la  superficie o en lo profundo del pecho.  Dolor opresivo, continuo o constrictivo.  Dolor vago o intenso que empeora al moverse, toser o inhalar profundamente.  Dolor que tambin se siente en la espalda, el cuello, el hombro o el brazo, o dolor que se irradia a cualquiera de estas zonas. El dolor de pecho puede aparecer y desaparecer, o bien puede ser constante. DIAGNSTICO Quizs se necesiten anlisis de laboratorio u otros estudios para encontrar la causa del dolor. El mdico puede indicarle que se haga una prueba llamada EGC (electrocadiograma) ambulatorio. El electrocardiograma registra los patrones de los latidos cardacos en el momento en que se realiza el estudio. Tambin pueden hacerle otros estudios, por ejemplo:  Ecocardiograma transtorcico (ETT). Durante el ecocardiograma, se usan ondas sonoras para crear una imagen de todas las estructuras cardacas y evaluar cmo circula la sangre por el corazn.  Ecocardiograma transesofgico (ETE).Este es un estudio de diagnstico por imgenes ms avanzado que el obtiene imgenes del interior del cuerpo. Le permite al mdico ver el corazn con mayor detalle.  Monitoreo cardaco. Permite que el mdico controle la frecuencia y el ritmo cardaco en tiempo real.  Monitor Holter. Es un dispositivo porttil que registra los latidos del corazn y puede ayudar a diagnosticar las arritmias cardacas. Le permite al mdico registrar la actividad cardaca durante varios das, si es necesario.  Pruebas de esfuerzo. Estas pueden realizarse durante el ejercicio o mediante la administracin de un medicamento que acelera los latidos del corazn.  Anlisis de sangre.  Diagnstico por imgenes. TRATAMIENTO  El tratamiento depende de la causa del dolor de pecho. El tratamiento puede incluir lo siguiente:  Medicamentos. Estos pueden incluir lo siguiente:    Inhibidores de la acidez estomacal.  Antiinflamatorios.  Analgsicos para las enfermedades  inflamatorias.  Antibiticos, si hay una infeccin.  Medicamentos para disolver los cogulos sanguneos.  Medicamentos para tratar la enfermedad arterial coronaria.  Tratamiento complementario para las enfermedades que no requieren la toma de medicamentos. Esto puede incluir lo siguiente:  Descansar.  Aplicar compresas fras o calientes en las zonas lesionadas.  Limitar las actividades hasta que disminuya el dolor. INSTRUCCIONES PARA EL CUIDADO EN EL HOGAR  Si le recetaron antibiticos, asegrese de terminarlos, incluso si comienza a sentirse mejor.  Evite las actividades que le causen dolor de pecho.  No consuma ningn producto que contenga tabaco, lo que incluye cigarrillos, tabaco de mascar o cigarrillos electrnicos. Si necesita ayuda para dejar de fumar, consulte al mdico.  No beba alcohol.  Tome los medicamentos solamente como se lo haya indicado el mdico.  Concurra a todas las visitas de control como se lo haya indicado el mdico. Esto es importante. Esto incluye otros estudios si el dolor de pecho no desaparece.  Si la acidez es la causa del dolor de pecho, tal vez le aconsejen que mantenga la cabeza levantada (elevada) mientras duerme. Esto reduce la probabilidad de que el cido retroceda del estmago al esfago.  Haga cambios en su estilo de vida como se lo haya indicado el mdico. Estos pueden incluir lo siguiente:  Practicar actividad fsica con regularidad. Pida al mdico que le sugiera algunas actividades que sean seguras para usted.  Consumir una dieta cardiosaludable. Un nutricionista matriculado puede ayudarlo a hacer elecciones saludables.  Mantener un peso saludable.  Controlar la diabetes, si es necesario.  Reducir las situaciones de estrs. SOLICITE ATENCIN MDICA SI:  El dolor de pecho no desaparece despus del tratamiento.  Tiene una erupcin cutnea con ampollas en el pecho.  Tiene fiebre. SOLICITE ATENCIN MDICA DE INMEDIATO SI:   El  dolor en el pecho es ms intenso.  La tos empeora, o expectora sangre.  Siente un dolor abdominal intenso.  Siente debilidad intensa.  Se desmaya.  Tiene escalofros.  Tiene una molestia repentina e inexplicable en el pecho.  Tiene molestias repentinas e inexplicables en los brazos, la espalda, el cuello o la mandbula.  Le falta el aire en cualquier momento.  Comienza a sudar de manera repentina o la piel se le humedece.  Siente nuseas o vomita.  Se siente repentinamente mareado o se desmaya.  Siente que el corazn comienza a latir rpidamente o que se saltea latidos. Estos sntomas pueden representar un problema grave que constituye una emergencia. No espere hasta que los sntomas desaparezcan. Solicite atencin mdica de inmediato. Comunquese con el servicio de emergencias de su localidad (911 en los [place]). No conduzca por sus propios medios hasta el hospital.   Esta informacin no tiene como fin reemplazar el consejo del mdico. Asegrese de hacerle al mdico cualquier pregunta que tenga.   Document Released: 07/24/2005 Document Revised: 08/14/2014 Elsevier Interactive Patient Education 2016 Elsevier Inc.  

## 2015-11-23 ENCOUNTER — Telehealth: Payer: Self-pay | Admitting: Internal Medicine

## 2015-11-23 NOTE — Telephone Encounter (Signed)
APPT. REMINDER CALL, LMTCB °

## 2015-11-24 ENCOUNTER — Ambulatory Visit: Payer: Self-pay

## 2015-12-01 NOTE — Addendum Note (Signed)
Addended by: Debe CoderMULLEN, Anthonella Klausner B on: 12/01/2015 03:40 PM   Modules accepted: Level of Service

## 2015-12-01 NOTE — Progress Notes (Signed)
Internal Medicine Clinic Attending  Case discussed with Dr. Truong soon after the resident saw the patient.  We reviewed the resident's history and exam and pertinent patient test results.  I agree with the assessment, diagnosis, and plan of care documented in the resident's note. 

## 2015-12-06 ENCOUNTER — Ambulatory Visit: Payer: Self-pay

## 2016-01-26 ENCOUNTER — Encounter: Payer: Self-pay | Admitting: Internal Medicine

## 2016-01-26 ENCOUNTER — Ambulatory Visit (INDEPENDENT_AMBULATORY_CARE_PROVIDER_SITE_OTHER): Payer: Self-pay | Admitting: Internal Medicine

## 2016-01-26 VITALS — BP 141/91 | HR 72 | Temp 98.5°F | Ht 65.0 in | Wt 184.4 lb

## 2016-01-26 DIAGNOSIS — G8929 Other chronic pain: Secondary | ICD-10-CM | POA: Insufficient documentation

## 2016-01-26 DIAGNOSIS — R1084 Generalized abdominal pain: Secondary | ICD-10-CM

## 2016-01-26 DIAGNOSIS — R51 Headache: Secondary | ICD-10-CM

## 2016-01-26 DIAGNOSIS — R519 Headache, unspecified: Secondary | ICD-10-CM

## 2016-01-26 DIAGNOSIS — G44209 Tension-type headache, unspecified, not intractable: Secondary | ICD-10-CM

## 2016-01-26 DIAGNOSIS — R109 Unspecified abdominal pain: Secondary | ICD-10-CM

## 2016-01-26 NOTE — Patient Instructions (Signed)
1. Follow up with ultrasound of your abdomen. They may need to do a pelvic ultrasound to look at your ovaries. 2. Try a clear liquid and soft diet.  Avoid fatty foods. 3. Return to clinic in 1 week.  Dieta de lquidos claros (Clear Liquid Diet) Una dieta de lquidos claros es una dieta a corto plazo que se indica para Media planner lquido y la energa bsica que se necesitan cuando no puede ingerir otra cosa. La dieta de lquidos claros consiste en lquidos o slidos que se transforman en lquidos a Publishing rights manager. Debe poder ver a travs del lquido. Hay varios motivos por los que se lo puede limitar a lquidos claros, por ejemplo:  Cuando tiene una enfermedad repentina (aguda) antes o despus de [place].  Para ayudar al cuerpo a volver a adaptarse lentamente a los alimentos despus de un perodo prolongado sin poder consumirlos.  Reemplazo de lquidos cuando tiene una enfermedad diarreica.  Cuando se Nature conservation officer determinados estudios, como una colonoscopa, en la que se insertan instrumentos dentro del cuerpo para observar las partes del [place]. QU PUEDO INGERIR? Una dieta de lquidos claros no proporciona todos los nutrientes que usted necesita. Es importante escoger una variedad de las siguientes opciones para obtener la mayor cantidad posible de nutrientes.  Jugos de vegetales sin pulpa.  Jugos de frutas y bebidas preparadas con frutas sin pulpa.  Caf (comn o descafeinado), t o gaseosa segn el criterio de su mdico.  Caldos claros o sopas a base de caldos.  Gelatinas saborizadas y ricas en protenas.  Azcar o miel.  Sorbetes o helados de agua que no [employer]. Si no est seguro de si puede consumir algunos lquidos, debe preguntarle a su mdico. Tambin puede preguntarle al mdico si hay otras opciones de lquidos claros.   Esta informacin no tiene Theme park manager el consejo del mdico. Asegrese de hacerle al mdico cualquier pregunta  que tenga.   Document Released: 07/24/2005 Document Revised: 07/29/2013 Elsevier Interactive Patient Education 2016 [employer].  Colecistitis (Cholecystitis) La colecistitis es la inflamacin de la vescula biliar. A menudo se la conoce como ataque de la vescula biliar. La vescula biliar es un rgano que tiene forma de pera y se encuentra debajo del hgado, del lado derecho del cuerpo. Almacena bilis, un lquido que ayuda a [employer]. Si la bilis se acumula en la vescula biliar, esta se inflama. La afeccin se puede presentar de manera repentina [place]). Los episodios reiterados de colecistitis aguda o los que son prolongados pueden derivar en una enfermedad crnica. La colecistitis es grave y requiere TEFL teacher.  CAUSAS La causa ms frecuente de esta afeccin son los clculos biliares. Los clculos pueden obstruir los conductos (vas biliares) que transportan la bilis desde la vescula biliar, y esto causa la acumulacin de [place] lquido. Otras causas de esta afeccin incluyen lo siguiente:  Dao a la vescula biliar debido a una disminucin del flujo sanguneo.  Infecciones de las vas biliares.  Cicatrices y obstrucciones en las vas biliares.  Tumores en el hgado, el pncreas o la vescula biliar. FACTORES DE RIESGO Es ms probable que esta afeccin se manifieste en:  Las personas que tienen anemia drepanoctica.  Las personas que toman anticonceptivos o usan estrgeno.  Las personas que tienen enfermedad heptica por alcoholismo.  Las personas con cirrosis heptica.  Las personas que reciben alimentacin a travs de una vena (alimentacin parenteral).  Las [employer] no consumen alimentos ni lquidos (hacen ayuno) durante mucho tiempo.  Las [place] Tool Workspersonas obesas.  Las Government social research officerpersonas que adelgazan rpidamente.  Las Freerembarazadas.  Las personas con Automotive engineeraltas concentraciones de triglicridos.  Las personas que tienen pancreatitis. SNTOMAS Los sntomas de esta afeccin  incluyen lo siguiente:  Dolor abdominal, especialmente en la zona superior derecha del abdomen.  Dolor con la palpacin en el abdomen o meteorismo.  Nuseas.  Vmitos.  Grant RutsFiebre.  Escalofros.  Coloracin amarillenta de la piel y la zona Ghislaine de los ojos (ictericia). DIAGNSTICO Esta afeccin se diagnostica mediante la historia clnica y un examen fsico. Tambin pueden hacerle otros estudios, por ejemplo:  Pruebas de diagnstico por imgenes, como:  Ecografa de la vescula biliar.  Tomografa computarizada del abdomen.  Gammagrafa hepatobiliar con cido iminodiactico (HIDA). Este estudio permite que el mdico observe el trnsito de la bilis desde el hgado a la vescula biliar y Elaina Hoopshasta el intestino delgado.  Resonancia magntica.  Anlisis de sangre, como los siguientes:  Hemograma completo, porque el recuento de glbulos blancos puede ser ms alto que lo normal.  Pruebas funcionales hepticas, porque algunos niveles pueden ser ms altos que lo normal cuando hay determinados tipos de clculos biliares. TRATAMIENTO El tratamiento puede incluir lo siguiente:  Hacer ayuno durante cierto [place]ontiempo.  Lquidos por va IV.  Medicamentos para tratar Chief Technology Officerel dolor o los vmitos.  Antibiticos.  Ciruga para extirpar la vescula biliar (colecistectoma). Esta puede realizarse de inmediato o ms adelante. INSTRUCCIONES PARA EL CUIDADO EN EL HOGAR El cuidado en el hogar depender del tipo de [place]tratamiento. En general:  Baxter Internationalome los medicamentos de venta libre y los recetados solamente como se lo haya indicado el mdico.  Si le recetaron un antibitico, tmelo como se lo haya indicado el mdico. No deje de tomar los antibiticos aunque comience a sentirse mejor.  Siga las indicaciones del mdico respecto de qu comer o beber. Cuando le permitan comer, no coma ni beba nada que desencadene los sntomas.  Concurra a todas las visitas de control como se lo haya indicado el mdico. Esto es  importante. SOLICITE ATENCIN MDICA SI:  El dolor no se alivia con los United Parcelmedicamentos.  Tiene fiebre. SOLICITE ATENCIN MDICA DE INMEDIATO SI:  El dolor se desplaza a otra parte del abdomen o a la espalda.  Sigue teniendo sntomas o tiene sntomas nuevos, incluso con Scientist, research (medical)el tratamiento.   Esta informacin no tiene Theme park managercomo fin reemplazar el consejo del mdico. Asegrese de hacerle al mdico cualquier pregunta que tenga.   Document Released: 05/03/2005 Document Revised: 04/14/2015 Elsevier Interactive Patient Education [employer]2016 Elsevier Inc.

## 2016-01-26 NOTE — Progress Notes (Signed)
Internal Medicine Clinic Attending  Case discussed with Dr. Taylor at the time of the visit.  We reviewed the resident's history and exam and pertinent patient test results.  I agree with the assessment, diagnosis, and plan of care documented in the resident's note. 

## 2016-01-26 NOTE — Assessment & Plan Note (Signed)
Patient continues to complain of HAs, sometimes 4 times a week, described as stabbing pain in the temples without radiation.  Sometimes her neck and eyes will also hurt.  She denies preceding aura, but notes one occurrence of blurry vision with her HA.  She has been having them since 35 yo and she has not noticed a relation to her periods. She takes Ibuprofen 400 mg for the headache.  A/P: Tension HA, possibly exacerbated by chronic NSAID use.  Patient counseled that chronic NSAIDs can worsen daily HAs, but more in depth treatment deferred for evaluation of abdominal pain.  Patient to return in 1 week, at which time symptoms can be reevaluated and HA prophylaxis may be considered. - Ibuprofen 400 mg TID PRN

## 2016-01-26 NOTE — Progress Notes (Signed)
Patient ID: Nichole Jackson, female   DOB: 1980/08/17, 35 y.o.   MRN: [id number]   Subjective:   Patient ID: Nichole Jackson female   DOB: 1980/08/17 35 y.o.   MRN: [id number]  HPI: [name] is a 35 y.o. female with PMH as below, here for eval abdominal pain and HAs.  Please see Problem-Based charting for the status of the patient's chronic medical issues.     Past Medical History  Diagnosis Date  . Chronic kidney disease     stones  . No pertinent past medical history   . Hypertension   . Hyperlipidemia    No current outpatient prescriptions on file.   No current facility-administered medications for this visit.   Family History  Problem Relation Age of Onset  . Hypertension Mother   . Diabetes Mother   . Hypertension Maternal Grandmother   . Diabetes Maternal Grandmother   . Heart disease Maternal Grandmother    Social History   Social History  . Marital Status: Married    Spouse Name: N/A  . Number of Children: N/A  . Years of Education: N/A   Social History Main Topics  . Smoking status: Never Smoker   . Smokeless tobacco: None  . Alcohol Use: No  . Drug Use: None  . Sexual Activity: Not Asked   Other Topics Concern  . None   Social History Narrative   Review of Systems: Pertinent items noted in Problem-Based charting. Objective:  Physical Exam: Filed Vitals:   01/26/16 0827  BP: 141/91  Pulse: 72  Temp: 98.5 F (36.9 C)  TempSrc: Oral  Height: 5\' 5"  (1.651 m)  Weight: 184 lb 6.4 oz (83.643 kg)  SpO2: 100%   Physical Exam  Constitutional: She is oriented to person, place, and time and well-developed, well-nourished, and in no distress. No distress.  HENT:  Head: Normocephalic and atraumatic.  Eyes: EOM are normal. No scleral icterus.  Neck: No tracheal deviation present.  Cardiovascular: Normal rate, regular rhythm and normal heart sounds.   Pulmonary/Chest: Effort normal and breath sounds normal. No stridor. No  respiratory distress. She has no wheezes. She has no rales.  Abdominal: Soft. She exhibits no distension. There is no rebound and no guarding.  Tender to deep palpation in RUQ, epigastrum, and lower quadrants. Murphy's sign positive.  Midline and transverse scars present at sites of previous C-sections.  Musculoskeletal: She exhibits no edema.  Neurological: She is alert and oriented to person, place, and time.  Skin: Skin is warm and dry. She is not diaphoretic.     Assessment & Plan:   Patient and case were discussed with Dr. Rogelia BogaButcher.  Please refer to Problem Based charting for further documentation.

## 2016-01-26 NOTE — Assessment & Plan Note (Signed)
Patient reports constant, stabbing abdominal pain to the lower quadrants without radiation since Sunday.  The pain is worse with food and decreases when she is not eating.  She has BMs up to three times daily but denies diarrhea, constipation, N/V, fevers, chills, dysuria, hematochezia, or melena.  She does not take any medications except Ibuprofen 400 mg BID PRN abdominal pain or HA.  Patient's menstrual periods are irregular 2/2 Implanon contraceptive.  Exam significant for tenderness to deep palpation to lower quadrants, RUQ, and epigastrum, with positive Murphy's sign.  Dermal lymphadenopathy appreciated.  A/P: Likely cholecystitis given patient's Hispanic origin, age, sex, weight, and positive Murphy's sign.  Pain is exacerbated with food.  However, she also complains of pain in the epigastrum as well as the lower quadrants.  Therefore, differential also includes gastritis/PUD, pancreatitis, or ovarian pathology. Will start with abdominal US to eval liver/GB and ask for evaluation of ovaries.  If ovaries cannot be visualized on transabdominal US, will add on pelvic US.  If US negative for underlying etiology, patient may benefit from CT abd/pelvis to evaluate pancreas. [ ]  CMP [ ]  CBC diff [ ]  Abdominal US - RTC 1 week

## 2016-01-27 LAB — CBC WITH DIFFERENTIAL/PLATELET
Basophils Absolute: 0 10*3/uL (ref 0.0–0.2)
Basos: 1 %
EOS (ABSOLUTE): 0.3 10*3/uL (ref 0.0–0.4)
Eos: 4 %
HEMATOCRIT: 44 % (ref 34.0–46.6)
HEMOGLOBIN: 14.6 g/dL (ref 11.1–15.9)
Immature Grans (Abs): 0 10*3/uL (ref 0.0–0.1)
Immature Granulocytes: 0 %
LYMPHS ABS: 2.2 10*3/uL (ref 0.7–3.1)
Lymphs: 27 %
MCH: 29.3 pg (ref 26.6–33.0)
MCHC: 33.2 g/dL (ref 31.5–35.7)
MCV: 88 fL (ref 79–97)
MONOCYTES: 6 %
Monocytes Absolute: 0.5 10*3/uL (ref 0.1–0.9)
NEUTROS ABS: 5.2 10*3/uL (ref 1.4–7.0)
Neutrophils: 62 %
Platelets: 239 10*3/uL (ref 150–379)
RBC: 4.99 x10E6/uL (ref 3.77–5.28)
RDW: 13.1 % (ref 12.3–15.4)
WBC: 8.2 10*3/uL (ref 3.4–10.8)

## 2016-01-27 LAB — CMP14 + ANION GAP
ALT: 5 IU/L (ref 0–32)
AST: 15 IU/L (ref 0–40)
Albumin/Globulin Ratio: 1.5 (ref 1.2–2.2)
Albumin: 4.1 g/dL (ref 3.5–5.5)
Alkaline Phosphatase: 97 IU/L (ref 39–117)
Anion Gap: 15 mmol/L (ref 10.0–18.0)
BUN/Creatinine Ratio: 22 (ref 9–23)
BUN: 12 mg/dL (ref 6–20)
Bilirubin Total: 0.5 mg/dL (ref 0.0–1.2)
CO2: 23 mmol/L (ref 18–29)
Calcium: 9 mg/dL (ref 8.7–10.2)
Chloride: 104 mmol/L (ref 96–106)
Creatinine, Ser: 0.55 mg/dL — ABNORMAL LOW (ref 0.57–1.00)
GFR calc Af Amer: 141 mL/min/{1.73_m2} (ref >59)
GFR calc non Af Amer: 122 mL/min/{1.73_m2} (ref >59)
Globulin, Total: 2.8 g/dL (ref 1.5–4.5)
Glucose: 78 mg/dL (ref 65–99)
Potassium: 4.3 mmol/L (ref 3.5–5.2)
Sodium: 142 mmol/L (ref 134–144)
Total Protein: 6.9 g/dL (ref 6.0–8.5)

## 2016-02-04 ENCOUNTER — Telehealth: Payer: Self-pay | Admitting: *Deleted

## 2016-02-04 ENCOUNTER — Other Ambulatory Visit: Payer: Self-pay | Admitting: Oncology

## 2016-02-04 ENCOUNTER — Ambulatory Visit (HOSPITAL_COMMUNITY)
Admission: RE | Admit: 2016-02-04 | Discharge: 2016-02-04 | Disposition: A | Discharge status: Home / Self Care | Payer: Self-pay | Source: Ambulatory Visit | Attending: Oncology | Admitting: Oncology

## 2016-02-04 ENCOUNTER — Encounter: Payer: Self-pay | Admitting: Oncology

## 2016-02-04 ENCOUNTER — Ambulatory Visit (HOSPITAL_COMMUNITY)
Admission: RE | Admit: 2016-02-04 | Discharge: 2016-02-04 | Disposition: A | Discharge status: Home / Self Care | Payer: Self-pay | Source: Ambulatory Visit | Attending: Internal Medicine | Admitting: Internal Medicine

## 2016-02-04 DIAGNOSIS — K760 Fatty (change of) liver, not elsewhere classified: Secondary | ICD-10-CM | POA: Insufficient documentation

## 2016-02-04 DIAGNOSIS — N83201 Unspecified ovarian cyst, right side: Secondary | ICD-10-CM | POA: Insufficient documentation

## 2016-02-04 DIAGNOSIS — R1084 Generalized abdominal pain: Secondary | ICD-10-CM

## 2016-02-04 DIAGNOSIS — R109 Unspecified abdominal pain: Secondary | ICD-10-CM | POA: Insufficient documentation

## 2016-02-04 DIAGNOSIS — K824 Cholesterolosis of gallbladder: Secondary | ICD-10-CM | POA: Insufficient documentation

## 2016-02-04 NOTE — Telephone Encounter (Addendum)
Call from radiology dept to inform clinic that pt has arrived to have an abdominal ultrasound.  Per DrTaylor's comments on the order-"Please investigate RUQ and ovaries. If unable to visualize ovaries, please reflex to pelvic US". MD also wishes to eval pt's liver and gallbladder.  The following orders were place  Ultrasound Limited RUQ to eval liver/GB and Pelvic ultrasound complete with transvaginal to eval ovaries.  Discussed with Dr.Granfortuna .Marland Kitchen.Nichole Jackson, Nichole Samples Cassady6/30/201712:17 PM

## 2016-02-04 NOTE — Progress Notes (Signed)
A user error has taken place: encounter opened in error, closed for administrative reasons.

## 2016-02-04 NOTE — Progress Notes (Signed)
Patient ID: Nichole MowBlanca Suarez-Alvarez, female   DOB: 12-02-1980, 35 y.o.   MRN: [id number] I called patient with ultrasound results. She had to put her son on the phone since my Spanish skills are poor. The gallbladder was normal. No acute inflammation. A small benign appearing polyp which I did not try to explain. The right ovary was normal. Small benign appearing cyst which I did not try to explain. The left ovary was not visualized. Test ordered by Dr Venia MinksNicholas Taylor who just graduated from his internship here. Patient not yet assigned to a new primary care MD. It would be best if a native Spanish speaker could call to make sure she understands what I told her & her son.

## 2016-02-10 ENCOUNTER — Telehealth: Payer: Self-pay | Admitting: *Deleted

## 2016-02-10 NOTE — Telephone Encounter (Signed)
Nichole Jackson, Medical Interpreter, called Nichole Jackson on 02/09/16 at 4 PM to review her ultrasound results in Spanish, her preferred language, at Dr. Patsy LagerGranfortuna's request after he had tried to give them to her via a family member on 02/04/16 by phone. [name] then called Nichole Jackson back on 02/10/16 at 10:15 AM to make an appt for [facility] to [facility] b/p check and to further discuss her results. Since [name] was unable to chart the phone calls in [facility], I am charting them for her today. [name], RN, 02/10/16, 4:09 PM

## 2016-02-11 ENCOUNTER — Encounter: Payer: Self-pay | Admitting: Internal Medicine

## 2016-02-11 ENCOUNTER — Ambulatory Visit (INDEPENDENT_AMBULATORY_CARE_PROVIDER_SITE_OTHER): Payer: Self-pay | Admitting: Internal Medicine

## 2016-02-11 VITALS — BP 140/72 | HR 68 | Temp 98.5°F | Ht 65.0 in | Wt 188.5 lb

## 2016-02-11 DIAGNOSIS — R51 Headache: Principal | ICD-10-CM

## 2016-02-11 DIAGNOSIS — Z72820 Sleep deprivation: Secondary | ICD-10-CM

## 2016-02-11 DIAGNOSIS — G44229 Chronic tension-type headache, not intractable: Secondary | ICD-10-CM

## 2016-02-11 DIAGNOSIS — R519 Headache, unspecified: Secondary | ICD-10-CM

## 2016-02-11 NOTE — Progress Notes (Signed)
Internal Medicine Clinic Attending  Case discussed with Dr. Saraiya at the time of the visit.  We reviewed the resident's history and exam and pertinent patient test results.  I agree with the assessment, diagnosis, and plan of care documented in the resident's note.  

## 2016-02-11 NOTE — Patient Instructions (Signed)
Thank you for your visit today Please take tylenol as needed. Please try to avoid ibuprofen as sometimes overusing that can also cause headache. Please try to get more sleep  Please do the sleep study Please visit the optometrist/opthalmologist, to check your eyes, to see if your eye numbers may have changed

## 2016-02-11 NOTE — Progress Notes (Signed)
Patient ID: Jen MowBlanca Suarez-Alvarez, female   DOB: 1981/01/08, 35 y.o.   MRN: [id number]    CC: chronic headaches HPI: Ms.Timira Maudry DiegoSuarez-Alvarez is a 35 y.o. spanish speaking woman with PMH noted below who is here for further evaluation of her headaches.   Please see Problem List/A&P for the status of the patient's chronic medical problems   Past Medical History  Diagnosis Date  . Chronic kidney disease     stones  . No pertinent past medical history   . Hypertension   . Hyperlipidemia     Review of Systems:  Constitutional: Negative for fever, chills, weight loss and malaise/fatigue.  HEENT: +headaches, no vision problems,  Respiratory: Negative for cough, shortness of breat Gastrointestinal: Negative for nausea, vomiting Musculoskeletal: Negative for myalgias, joint pain Neurological: Negative for tingling. No neuro symptoms or weakness.   Physical Exam: Filed Vitals:   02/11/16 0932  BP: 140/72  Pulse: 68  Temp: 98.5 F (36.9 C)  TempSrc: Oral  Height: 5\' 5"  (1.651 m)  Weight: 188 lb 8 oz (85.503 kg)  SpO2: 100%    General: A&O, in NAD, wt 188 lbs HEENT: PERRLA CV: RRR, normal s1, s2, no m/r/g, no carotid bruits appreciated Resp: equal and symmetric breath sounds, no wheezing or crackles  Abdomen: soft, nontender, nondistended, +BS Skin: warm, dry, intact, no open lesions or rashes noted Neurologic: no focal neurological deficits   Assessment & Plan:   See encounters tab for problem based medical decision making. Patient discussed with Dr. Cleda DaubE. Hoffman

## 2016-02-11 NOTE — Assessment & Plan Note (Signed)
Info obtained using translator. Patient has been having chronic headaches for the past 5 years, but recently in the last 6 months, they seemed to have increased in frequency. No acute change in the severity. HA happen at least 4 times a week, and each episode lasts about 2-3 hours. Headache is mainly bilaterally in the sides, and in the back. The light does not make the headache worse. She takes ibuprofen about 2-3 times a week which make the headache better. She denies any fevers, chills, constitutional or neurologic symptoms. Not related to her menses. She denies illicit drug use, smoking, or alcohol use.  She said she gets very few hours of sleep at night- sometimes 5-6 hours. She works Education officer, [employer]ing houses.  She did visit an optometrist about 3 months ago and no change in the eye glass numbers.   Assessment: Chronic tension type headaches, less likely to be medication induced. Lack of sleep and poor sleep quality certainly contributing to her headaches. Previously , elevated blood pressure was attributed as a cause- we can not diagnose hypertension, without getting 2 elevated blood pressure numbers, and her BP today was 140/72.   Plan -Aromatherapy given to the patient  - Meditation, and showed her some techniques -Advised improved sleep hygiene, and completion of the previously recommended sleep study  -Advised to take Tylenol PRn, and Ibuprofen PRN. Advised to not take daily ibuprofen as it can make headaches worse -F/U for her HLD in 1-2 months  -Of note- I also explained the ultrasound results and she indicated understanding of that.

## 2016-03-24 ENCOUNTER — Telehealth: Payer: Self-pay | Admitting: Internal Medicine

## 2016-03-24 NOTE — Telephone Encounter (Signed)
  Reason for call:   I placed an outgoing call to [name] at 520 PM regarding her upcoming ophthalmology appointment. We spoke in [place].   Assessment/ Plan:   She confirmed that she did indeed receive a letter indicating she has an ophthalmology appointment scheduled on September 28.  She thanked me for my time and consideration.    Beather Arbourushil V Lamberto Dinapoli, MD   03/24/2016, 5:23 PM

## 2016-06-16 ENCOUNTER — Ambulatory Visit: Payer: Self-pay

## 2016-06-21 ENCOUNTER — Ambulatory Visit: Payer: Self-pay

## 2016-12-22 ENCOUNTER — Encounter (HOSPITAL_COMMUNITY): Payer: Self-pay | Admitting: *Deleted

## 2016-12-22 ENCOUNTER — Emergency Department (HOSPITAL_COMMUNITY)
Admission: EM | Admit: 2016-12-22 | Discharge: 2016-12-22 | Disposition: A | Discharge status: Home / Self Care | Payer: Self-pay | Attending: Emergency Medicine | Admitting: Emergency Medicine

## 2016-12-22 ENCOUNTER — Emergency Department (HOSPITAL_COMMUNITY): Payer: Self-pay

## 2016-12-22 DIAGNOSIS — R1031 Right lower quadrant pain: Secondary | ICD-10-CM | POA: Insufficient documentation

## 2016-12-22 DIAGNOSIS — R109 Unspecified abdominal pain: Secondary | ICD-10-CM

## 2016-12-22 DIAGNOSIS — I129 Hypertensive chronic kidney disease with stage 1 through stage 4 chronic kidney disease, or unspecified chronic kidney disease: Secondary | ICD-10-CM | POA: Insufficient documentation

## 2016-12-22 DIAGNOSIS — R1032 Left lower quadrant pain: Secondary | ICD-10-CM | POA: Insufficient documentation

## 2016-12-22 DIAGNOSIS — N189 Chronic kidney disease, unspecified: Secondary | ICD-10-CM | POA: Insufficient documentation

## 2016-12-22 DIAGNOSIS — Z79899 Other long term (current) drug therapy: Secondary | ICD-10-CM | POA: Insufficient documentation

## 2016-12-22 LAB — CBC
HEMATOCRIT: 41.2 % (ref 36.0–46.0)
HEMOGLOBIN: 14.1 g/dL (ref 12.0–15.0)
MCH: 29.9 pg (ref 26.0–34.0)
MCHC: 34.2 g/dL (ref 30.0–36.0)
MCV: 87.5 fL (ref 78.0–100.0)
Platelets: 212 10*3/uL (ref 150–400)
RBC: 4.71 MIL/uL (ref 3.87–5.11)
RDW: 12.9 % (ref 11.5–15.5)
WBC: 11.3 10*3/uL — ABNORMAL HIGH (ref 4.0–10.5)

## 2016-12-22 LAB — COMPREHENSIVE METABOLIC PANEL
ALK PHOS: 82 U/L (ref 38–126)
ALT: 25 U/L (ref 14–54)
ANION GAP: 8 (ref 5–15)
AST: 26 U/L (ref 15–41)
Albumin: 3.7 g/dL (ref 3.5–5.0)
BILIRUBIN TOTAL: 0.6 mg/dL (ref 0.3–1.2)
BUN: 7 mg/dL (ref 6–20)
CO2: 23 mmol/L (ref 22–32)
Calcium: 8.5 mg/dL — ABNORMAL LOW (ref 8.9–10.3)
Chloride: 108 mmol/L (ref 101–111)
Creatinine, Ser: 0.59 mg/dL (ref 0.44–1.00)
GFR calc Af Amer: >60 mL/min (ref >60)
GFR calc non Af Amer: >60 mL/min (ref >60)
GLUCOSE: 99 mg/dL (ref 65–99)
POTASSIUM: 3.3 mmol/L — AB (ref 3.5–5.1)
SODIUM: 139 mmol/L (ref 135–145)
Total Protein: 7.2 g/dL (ref 6.5–8.1)

## 2016-12-22 LAB — URINALYSIS, ROUTINE W REFLEX MICROSCOPIC
BILIRUBIN URINE: NEGATIVE
Glucose, UA: NEGATIVE mg/dL
HGB URINE DIPSTICK: NEGATIVE
KETONES UR: NEGATIVE mg/dL
Leukocytes, UA: NEGATIVE
Nitrite: NEGATIVE
PH: 6 (ref 5.0–8.0)
Protein, ur: NEGATIVE mg/dL
SPECIFIC GRAVITY, URINE: 1.025 (ref 1.005–1.030)

## 2016-12-22 LAB — PREGNANCY, URINE: Preg Test, Ur: NEGATIVE

## 2016-12-22 LAB — LIPASE, BLOOD: Lipase: 32 U/L (ref 11–51)

## 2016-12-22 MED ORDER — IOPAMIDOL (ISOVUE-300) INJECTION 61%
INTRAVENOUS | Status: AC
Start: 2016-12-22 — End: 2016-12-22
  Administered 2016-12-22: 100 mL
  Filled 2016-12-22: qty 100

## 2016-12-22 MED ORDER — SODIUM CHLORIDE 0.9 % IV BOLUS (SEPSIS)
1000.0000 mL | Freq: Once | INTRAVENOUS | Status: AC
  Administered 2016-12-22: 1000 mL via INTRAVENOUS

## 2016-12-22 MED ORDER — MORPHINE SULFATE (PF) 4 MG/ML IV SOLN
6.0000 mg | Freq: Once | INTRAVENOUS | Status: AC
  Administered 2016-12-22: 6 mg via INTRAVENOUS
  Filled 2016-12-22: qty 2

## 2016-12-22 NOTE — ED Notes (Signed)
Patient transported to CT 

## 2016-12-22 NOTE — ED Provider Notes (Signed)
[facility] Provider Note   CSN: [id number] Arrival date & time: 12/22/16  1515     History   Chief Complaint Chief Complaint  Patient presents with  . Abdominal Pain    HPI Nichole Jackson is a 36 y.o. female.  HPI  36 year old female with a 2 months of progressively worsening abdominal pain over the last 2 weeks it's been all day every day. No vaginal bleeding or vaginal discharge. No urinary symptoms. No nausea vomiting diarrhea or constipation. No fevers. No other associated modifying symptoms. No h/o same.  Past Medical History:  Diagnosis Date  . Chronic kidney disease    stones  . Hyperlipidemia   . Hypertension   . No pertinent past medical history     Patient Active Problem List   Diagnosis Date Noted  . Abdominal pain 01/26/2016  . Bilateral headaches 11/12/2015  . Preventative health care 11/12/2015  . Vitamin D deficiency 11/12/2015  . Screening 09/09/2014    Past Surgical History:  Procedure Laterality Date  . CESAREAN SECTION     x 2  . CESAREAN SECTION  07/27/2011   Procedure: CESAREAN SECTION;  Surgeon: Lesly Dukes, MD;  Location: [facility];  Service: Gynecology;  Laterality: N/A;  Previous classical    OB History    Gravida Para Term Preterm AB Living   3 3 3     3    SAB TAB Ectopic Multiple Live Births           1       Home Medications    Prior to Admission medications   Medication Sig Start Date End Date Taking? Authorizing Provider  acetaminophen (TYLENOL) 500 MG tablet Take 500 mg by mouth every 6 (six) hours as needed for mild pain.   Yes [provider]    Family History Family History  Problem Relation Age of Onset  . Hypertension Mother   . Diabetes Mother   . Hypertension Maternal Grandmother   . Diabetes Maternal Grandmother   . Heart disease Maternal Grandmother     Social History Social History  Substance Use Topics  . Smoking status: Never Smoker  . Smokeless tobacco: Never Used  .  Alcohol use No     Allergies   Patient has no known allergies.   Review of Systems Review of Systems  All other systems reviewed and are negative.    Physical Exam Updated Vital Signs BP (!) 143/85   Pulse 77   Temp 98.7 F (37.1 C) (Oral)   Resp 16   Ht 5\' 6"  (1.676 m)   Wt 195 lb (88.5 kg)   LMP 09/24/2016   SpO2 99%   BMI 31.47 kg/m   Physical Exam  Constitutional: She is oriented to person, place, and time. She appears well-developed and well-nourished.  HENT:  Head: Normocephalic and atraumatic.  Eyes: Conjunctivae and EOM are normal. Right eye exhibits no discharge. Left eye exhibits no discharge.  Neck: Normal range of motion.  Cardiovascular: Normal rate and regular rhythm.   Pulmonary/Chest: Effort normal and breath sounds normal. No stridor. No respiratory distress.  Abdominal: Soft. She exhibits no distension. There is tenderness (mild bilateral lower quadrants). There is no rebound and no guarding.  Neurological: She is alert and oriented to person, place, and time. No cranial nerve deficit. Coordination normal.  Skin: Skin is warm and dry.  Nursing note and vitals reviewed.    ED Treatments / Results  Labs (all labs ordered are listed, but  only abnormal results are displayed) Labs Reviewed  COMPREHENSIVE METABOLIC PANEL - Abnormal; Notable for the following:       Result Value   Potassium 3.3 (*)    Calcium 8.5 (*)    All other components within normal limits  CBC - Abnormal; Notable for the following:    WBC 11.3 (*)    All other components within normal limits  LIPASE, BLOOD  URINALYSIS, ROUTINE W REFLEX MICROSCOPIC  PREGNANCY, URINE    EKG  EKG Interpretation None       Radiology Ct Abdomen Pelvis W Contrast  Result Date: 12/22/2016 CLINICAL DATA:  Lower abdominal pain, nausea, vomiting and diarrhea x2 months EXAM: CT ABDOMEN AND PELVIS WITH CONTRAST TECHNIQUE: Multidetector CT imaging of the abdomen and pelvis was performed using  the standard protocol following bolus administration of intravenous contrast. CONTRAST:  100mL ISOVUE-300 IOPAMIDOL (ISOVUE-300) INJECTION 61% COMPARISON:  05/15/2005 CT pelvic ultrasound 02/04/2016 FINDINGS: Lower chest: No acute abnormality. Hepatobiliary: Normal gallbladder. No space-occupying mass of the liver. No biliary dilatation. Pancreas: Normal Spleen: Normal Adrenals/Urinary Tract: Adrenal glands are unremarkable. Kidneys are normal, without renal calculi, focal lesion, or hydronephrosis. Bladder is unremarkable. Stomach/Bowel: The stomach is physiologically distended. There is normal small bowel rotation without small bowel dilatation or inflammation. Normal appendix. Large bowel is unremarkable. Vascular/Lymphatic: No significant vascular findings are present. No enlarged abdominal or pelvic lymph nodes. Reproductive: Uterus and bilateral adnexa are unremarkable. Other: Postop scarring of the ventral pelvis from prior reported cesarean sections. Musculoskeletal: Mild-to-moderate broad-based disc bulge at L5-S1. No acute nor suspicious osseous abnormalities. IMPRESSION: 1. No acute abnormality within the abdomen or pelvis. 2. Mild to moderate central disc bulge at L5-S1. Electronically Signed   By: Tollie Ethavid  Kwon M.D.   On: 12/22/2016 20:27    Procedures Procedures (including critical care time)  Medications Ordered in ED Medications  morphine 4 MG/ML injection 6 mg (6 mg Intravenous Given 12/22/16 1836)  sodium chloride 0.9 % bolus 1,000 mL (0 mLs Intravenous Stopped 12/22/16 2040)  iopamidol (ISOVUE-300) 61 % injection (100 mLs  Contrast Given 12/22/16 2000)     Initial Impression / Assessment and Plan / ED Course  I have reviewed the triage vital signs and the nursing notes.  Pertinent labs & imaging results that were available during my care of the patient were reviewed by me and considered in my medical decision making (see chart for details).     Secondary worsening abdominal pain  and pressure will do a CT scan to evaluate for any malignancy or abscess that she seems to have had some bad abscesses in the past. This is negative patient likely be discharged to follow-up with her primary doctor.  Ct ok. pcp follow up advised. Repeat exam still non surgical. Tolerated PO.   Final Clinical Impressions(s) / ED Diagnoses   Final diagnoses:  Abdominal pain, unspecified abdominal location    New Prescriptions Discharge Medication List as of 12/22/2016  9:14 PM       Cyd Hostler, Barbara CowerJason, MD 12/23/16 1704

## 2016-12-22 NOTE — ED Triage Notes (Signed)
To ED for eval of lower abd pain with radiation to back for past 2 months. Nausea. No vomiting. No diarrhea. No pain with urination.

## 2017-01-02 ENCOUNTER — Ambulatory Visit (INDEPENDENT_AMBULATORY_CARE_PROVIDER_SITE_OTHER): Payer: Self-pay | Admitting: Internal Medicine

## 2017-01-02 ENCOUNTER — Encounter: Payer: Self-pay | Admitting: Internal Medicine

## 2017-01-02 VITALS — BP 127/71 | HR 69 | Temp 98.4°F | Ht 65.0 in | Wt 199.9 lb

## 2017-01-02 DIAGNOSIS — R1031 Right lower quadrant pain: Secondary | ICD-10-CM

## 2017-01-02 DIAGNOSIS — R109 Unspecified abdominal pain: Secondary | ICD-10-CM

## 2017-01-02 DIAGNOSIS — R1032 Left lower quadrant pain: Secondary | ICD-10-CM

## 2017-01-02 DIAGNOSIS — G8929 Other chronic pain: Secondary | ICD-10-CM

## 2017-01-02 NOTE — Progress Notes (Signed)
   CC: abdominal pain  HPI:  [name] is a 36 y.o. who presents today for abdominal pain. Please see assessment & plan for status of chronic medical problems.   Past Medical History:  Diagnosis Date  . Chronic kidney disease    stones  . Hyperlipidemia   . Hypertension   . No pertinent past medical history     Review of Systems:  Please see each problem below for a pertinent review of systems.   Physical Exam:  Vitals:   01/02/17 0938  BP: 127/71  Pulse: 69  Temp: 98.4 F (36.9 C)  TempSrc: Oral  SpO2: 99%  Weight: 199 lb 14.4 oz (90.7 kg)  Height: 5\' 5"  (1.651 m)   Physical Exam  Constitutional: She is oriented to person, place, and time. No distress.  HENT:  Head: Normocephalic and atraumatic.  Eyes: Conjunctivae are normal. No scleral icterus.  Pulmonary/Chest: Effort normal. No respiratory distress.  Abdominal: Soft. Bowel sounds are normal. She exhibits no distension and no mass. There is tenderness (Focal point tenderness of the right lower quadrant with some pain elicited in the bilateral lower quadrants). There is no rebound and no guarding.  Neurological: She is alert and oriented to person, place, and time.  Skin: She is not diaphoretic.   Assessment & Plan:   See Encounters Tab for problem based charting.  Patient seen with Dr. Oswaldo DoneVincent

## 2017-01-02 NOTE — Patient Instructions (Addendum)
Te llamar maana.  Haga una cita con el Dr. Mikey BussingHoffman para dar seguimiento en 1-3 meses.

## 2017-01-02 NOTE — Assessment & Plan Note (Signed)
Assessment She was seen in the ED 12/22/16 for 2-week history of abdominal pain. My independent review of her labwork at that time was reassuring for no LFT abnormalities or renal dysfunction to account for these symptoms, and her abdominal CT scan was also unrevealing for an acute cause.   Since the ED, she feels her pain is mildly better with acetaminophen and lying supine but worse with walking. She denies any change in appetite, nausea, vomiting, diarrhea, fever, change in weight.   At this point, I think her pain is mainly musculoskeletal and possibly from nerve entrapment related to excess adipose tissue. A trigger ponit injection would be both diagnostic and therapeutic.  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  All questions were answered.    The focal point of tenderness of the right lower quadrant was cleaned with alcohol swabs. Using a 25 gauge 1 inch needle the skin was injected with 2 mL of 1:1 mixture of Triamcinolone and 1% Lidocaine without Epi. The needle was withdrawn, the site was cleaned and covered with a band aid.   The patient did tolerate the procedure well and there were no complications.

## 2017-01-02 NOTE — Progress Notes (Signed)
Internal Medicine Clinic Attending  I saw and evaluated the patient.  I personally confirmed the key portions of the history and exam documented by Dr. Patel,Rushil and I reviewed pertinent patient test results.  The assessment, diagnosis, and plan were formulated together and I agree with the documentation in the resident's note. I was present for the entirety of the procedure.  

## 2017-01-15 ENCOUNTER — Ambulatory Visit: Payer: Self-pay

## 2017-01-25 ENCOUNTER — Ambulatory Visit: Payer: Self-pay

## 2017-02-08 ENCOUNTER — Encounter: Payer: Self-pay | Admitting: Internal Medicine

## 2017-11-28 ENCOUNTER — Emergency Department (HOSPITAL_COMMUNITY)
Admission: EM | Admit: 2017-11-28 | Discharge: 2017-11-29 | Disposition: A | Discharge status: Home / Self Care | Payer: Self-pay | Attending: Emergency Medicine | Admitting: Emergency Medicine

## 2017-11-28 ENCOUNTER — Encounter (HOSPITAL_COMMUNITY): Payer: Self-pay

## 2017-11-28 DIAGNOSIS — I129 Hypertensive chronic kidney disease with stage 1 through stage 4 chronic kidney disease, or unspecified chronic kidney disease: Secondary | ICD-10-CM | POA: Insufficient documentation

## 2017-11-28 DIAGNOSIS — K279 Peptic ulcer, site unspecified, unspecified as acute or chronic, without hemorrhage or perforation: Secondary | ICD-10-CM

## 2017-11-28 DIAGNOSIS — R1011 Right upper quadrant pain: Secondary | ICD-10-CM

## 2017-11-28 DIAGNOSIS — N189 Chronic kidney disease, unspecified: Secondary | ICD-10-CM | POA: Insufficient documentation

## 2017-11-28 LAB — URINALYSIS, ROUTINE W REFLEX MICROSCOPIC
BILIRUBIN URINE: NEGATIVE
GLUCOSE, UA: NEGATIVE mg/dL
Hgb urine dipstick: NEGATIVE
KETONES UR: 20 mg/dL — AB
LEUKOCYTES UA: NEGATIVE
NITRITE: NEGATIVE
PROTEIN: NEGATIVE mg/dL
Specific Gravity, Urine: 1.017 (ref 1.005–1.030)
pH: 6 (ref 5.0–8.0)

## 2017-11-28 LAB — COMPREHENSIVE METABOLIC PANEL
ALK PHOS: 88 U/L (ref 38–126)
ALT: 26 U/L (ref 14–54)
AST: 23 U/L (ref 15–41)
Albumin: 3.9 g/dL (ref 3.5–5.0)
Anion gap: 8 (ref 5–15)
BILIRUBIN TOTAL: 1.2 mg/dL (ref 0.3–1.2)
BUN: 6 mg/dL (ref 6–20)
CO2: 25 mmol/L (ref 22–32)
CREATININE: 0.54 mg/dL (ref 0.44–1.00)
Calcium: 9.1 mg/dL (ref 8.9–10.3)
Chloride: 103 mmol/L (ref 101–111)
GFR calc Af Amer: >60 mL/min (ref >60)
GFR calc non Af Amer: >60 mL/min (ref >60)
Glucose, Bld: 112 mg/dL — ABNORMAL HIGH (ref 65–99)
Potassium: 3.5 mmol/L (ref 3.5–5.1)
Sodium: 136 mmol/L (ref 135–145)
TOTAL PROTEIN: 7.3 g/dL (ref 6.5–8.1)

## 2017-11-28 LAB — I-STAT BETA HCG BLOOD, ED (MC, WL, AP ONLY): I-stat hCG, quantitative: <5 m[IU]/mL (ref <5)

## 2017-11-28 LAB — CBC
HCT: 42.5 % (ref 36.0–46.0)
Hemoglobin: 14.6 g/dL (ref 12.0–15.0)
MCH: 30.2 pg (ref 26.0–34.0)
MCHC: 34.4 g/dL (ref 30.0–36.0)
MCV: 88 fL (ref 78.0–100.0)
PLATELETS: 250 10*3/uL (ref 150–400)
RBC: 4.83 MIL/uL (ref 3.87–5.11)
RDW: 13.1 % (ref 11.5–15.5)
WBC: 17.9 10*3/uL — ABNORMAL HIGH (ref 4.0–10.5)

## 2017-11-28 LAB — LIPASE, BLOOD: Lipase: 28 U/L (ref 11–51)

## 2017-11-28 MED ORDER — OXYCODONE-ACETAMINOPHEN 5-325 MG PO TABS
1.0000 | ORAL_TABLET | ORAL | Status: AC | PRN
  Administered 2017-11-28 – 2017-11-29 (×2): 1 via ORAL
  Filled 2017-11-28 (×2): qty 1

## 2017-11-28 MED ORDER — ONDANSETRON 4 MG PO TBDP
4.0000 mg | ORAL_TABLET | Freq: Once | ORAL | Status: AC | PRN
  Administered 2017-11-28: 4 mg via ORAL
  Filled 2017-11-28: qty 1

## 2017-11-28 NOTE — ED Triage Notes (Signed)
Patient complains of epigastric pain that started yesterday and worse today with food, nausea and 1 episode of vomiting. Alert and oriented

## 2017-11-28 NOTE — ED Notes (Signed)
Results reviewed.  No changes to acuity at this time 

## 2017-11-29 ENCOUNTER — Emergency Department (HOSPITAL_COMMUNITY): Payer: Self-pay

## 2017-11-29 MED ORDER — SUCRALFATE 1 G PO TABS: 1.0000 g | ORAL_TABLET | Freq: Once | ORAL | Status: DC

## 2017-11-29 MED ORDER — ACETAMINOPHEN 325 MG PO TABS: 650.0000 mg | ORAL_TABLET | Freq: Four times a day (QID) | ORAL | 0 refills | Status: DC | PRN

## 2017-11-29 MED ORDER — OMEPRAZOLE 20 MG PO CPDR: 20.0000 mg | DELAYED_RELEASE_CAPSULE | Freq: Every day | ORAL | 0 refills | Status: DC

## 2017-11-29 MED ORDER — ONDANSETRON 4 MG PO TBDP
8.0000 mg | ORAL_TABLET | Freq: Once | ORAL | Status: AC
  Administered 2017-11-29: 8 mg via ORAL
  Filled 2017-11-29: qty 2

## 2017-11-29 MED ORDER — OXYCODONE-ACETAMINOPHEN 5-325 MG PO TABS
1.0000 | ORAL_TABLET | Freq: Once | ORAL | Status: AC
  Administered 2017-11-29: 1 via ORAL
  Filled 2017-11-29: qty 1

## 2017-11-29 MED ORDER — GI COCKTAIL ~~LOC~~: 30.0000 mL | Freq: Once | ORAL | Status: DC

## 2017-11-29 MED ORDER — HYDROCODONE-ACETAMINOPHEN 5-325 MG PO TABS: 1.0000 | ORAL_TABLET | Freq: Once | ORAL | Status: DC

## 2017-11-29 NOTE — ED Notes (Signed)
Notified MD of US results, awaiting new orders.

## 2017-11-29 NOTE — ED Notes (Signed)
E-signature not available, pt verbalized understanding of DC instructions  

## 2017-11-29 NOTE — ED Notes (Signed)
Called CT regarding delay in scan, pt ready.

## 2017-11-29 NOTE — ED Notes (Signed)
Family member requesting additional pain meds. Notified Dr. Rhunette CroftNanavati, see new orders

## 2017-11-29 NOTE — ED Provider Notes (Addendum)
[name] EMERGENCY DEPARTMENT Provider Note   CSN: [id number] Arrival date & time: 11/28/17  2148     History   Chief Complaint Chief Complaint  Patient presents with  . Abdominal Pain    HPI Nichole Jackson is a 37 y.o. female.  HPI  37 year old female comes in with chief complaint of abdominal pain. Patient has history of CKD, hypertension, hyperlipidemia.  Patient states that around 10 PM, but an hour after she had some tacos that she started having epigastric abdominal pain.  She has never had epigastric abdominal pain in the past.  Pain is radiating to her back.  Patient has associated nausea without vomiting.  Patient denies any UTI-like symptoms, vaginal discharge, vaginal bleeding or history of kidney stone.  Patient also denies any history of substance abuse or heavy alcohol use.  Patient son is translating for her and she has declined translation services, stating that she is comfortable with her son translating for her.  Past Medical History:  Diagnosis Date  . Chronic kidney disease    stones  . Hyperlipidemia   . Hypertension   . No pertinent past medical history     Patient Active Problem List   Diagnosis Date Noted  . Chronic abdominal pain 01/26/2016  . Bilateral headaches 11/12/2015  . Preventative health care 11/12/2015  . Vitamin D deficiency 11/12/2015  . Screening 09/09/2014    Past Surgical History:  Procedure Laterality Date  . CESAREAN SECTION     x 2  . CESAREAN SECTION  07/27/2011   Procedure: CESAREAN SECTION;  Surgeon: Lesly Dukes, MD;  Location: [facility];  Service: Gynecology;  Laterality: N/A;  Previous classical     OB History    Gravida  3   Para  3   Term  3   Preterm      AB      Living  3     SAB      TAB      Ectopic      Multiple      Live Births  1            Home Medications    Prior to Admission medications   Medication Sig Start Date End Date Taking? Authorizing  Provider  acetaminophen (TYLENOL) 325 MG tablet Take 2 tablets (650 mg total) by mouth every 6 (six) hours as needed. 11/29/17   Derwood Kaplan, MD  omeprazole (PRILOSEC) 20 MG capsule Take 1 capsule (20 mg total) by mouth daily. 11/29/17   Derwood Kaplan, MD    Family History Family History  Problem Relation Age of Onset  . Hypertension Mother   . Diabetes Mother   . Hypertension Maternal Grandmother   . Diabetes Maternal Grandmother   . Heart disease Maternal Grandmother     Social History Social History   Tobacco Use  . Smoking status: Never Smoker  . Smokeless tobacco: Never Used  Substance Use Topics  . Alcohol use: No    Alcohol/week: 0.0 oz  . Drug use: No     Allergies   Patient has no known allergies.   Review of Systems Review of Systems  Constitutional: Positive for activity change.  Gastrointestinal: Positive for abdominal pain and nausea. Negative for vomiting.  All other systems reviewed and are negative.    Physical Exam Updated Vital Signs BP (!) 161/76   Pulse 75   Temp 98.6 F (37 C) (Oral)   Resp 17   SpO2 98%  Physical Exam  Constitutional: She is oriented to person, place, and time. She appears well-developed.  HENT:  Head: Normocephalic and atraumatic.  Eyes: EOM are normal.  Neck: Normal range of motion. Neck supple.  Cardiovascular: Normal rate.  Pulmonary/Chest: Effort normal.  Abdominal: Bowel sounds are normal. There is tenderness in the right upper quadrant, epigastric area and left upper quadrant. There is guarding. There is no rigidity and negative Murphy's sign.  Neurological: She is alert and oriented to person, place, and time.  Skin: Skin is warm and dry.  Nursing note and vitals reviewed.    ED Treatments / Results  Labs (all labs ordered are listed, but only abnormal results are displayed) Labs Reviewed  COMPREHENSIVE METABOLIC PANEL - Abnormal; Notable for the following components:      Result Value   Glucose,  Bld 112 (*)    All other components within normal limits  CBC - Abnormal; Notable for the following components:   WBC 17.9 (*)    All other components within normal limits  URINALYSIS, ROUTINE W REFLEX MICROSCOPIC - Abnormal; Notable for the following components:   Ketones, ur 20 (*)    All other components within normal limits  LIPASE, BLOOD  I-STAT BETA HCG BLOOD, ED ([facility], [facility], [facility] ONLY)    EKG None  Radiology Ct Abdomen Pelvis Wo Contrast  Result Date: 11/29/2017 CLINICAL DATA:  Abdominal pain. EXAM: CT ABDOMEN AND PELVIS WITHOUT CONTRAST TECHNIQUE: Multidetector CT imaging of the abdomen and pelvis was performed following the standard protocol without IV contrast. COMPARISON:  Ultrasound abdomen 11/29/2017. CT abdomen and pelvis 12/22/2016 FINDINGS: Lower chest: The lung bases are clear. Hepatobiliary: Mild diffuse fatty infiltration of the liver. No focal lesions identified. Gallbladder and bile ducts are unremarkable. Pancreas: Unremarkable. No pancreatic ductal dilatation or surrounding inflammatory changes. Spleen: Normal in size without focal abnormality. Adrenals/Urinary Tract: Adrenal glands are unremarkable. Kidneys are normal, without renal calculi, focal lesion, or hydronephrosis. Bladder is unremarkable. Stomach/Bowel: Stomach is within normal limits. Appendix appears normal. No evidence of bowel wall thickening, distention, or inflammatory changes. Vascular/Lymphatic: No significant vascular findings are present. No enlarged abdominal or pelvic lymph nodes. Reproductive: 3.4 cm cyst on the right ovary, likely representing benign physiologic cyst. Uterus and left ovary are unremarkable. Other: No free air or free fluid in the abdomen. Scarring in the low pelvis anterior wall consistent with postoperative scar. Abdominal wall musculature appears intact. Musculoskeletal: No acute or significant osseous findings. IMPRESSION: 1. No acute process demonstrated in the abdomen or pelvis. No  evidence of bowel obstruction or inflammation. 2. Mild diffuse fatty infiltration of the liver. 3. Probable physiologic cyst on the right ovary. No abnormal adnexal masses. Electronically Signed   By: Burman Nieves M.D.   On: 11/29/2017 06:25   Dg Chest 1 View  Result Date: 11/29/2017 CLINICAL DATA:  Epigastric pain today, vomited. History of hypertension. EXAM: CHEST  1 VIEW COMPARISON:  Chest radiograph November 17, 2015 FINDINGS: Cardiomediastinal silhouette is normal. No pleural effusions or focal consolidations. Trachea projects midline and there is no pneumothorax. Soft tissue planes and included osseous structures are non-suspicious. Gas-filled nondistended included large bowel. IMPRESSION: Negative. Electronically Signed   By: Awilda Metro M.D.   On: 11/29/2017 03:23   US Abdomen Limited Ruq  Result Date: 11/29/2017 CLINICAL DATA:  Right upper quadrant pain. EXAM: ULTRASOUND ABDOMEN LIMITED RIGHT UPPER QUADRANT COMPARISON:  CT abdomen and pelvis 12/22/2016 FINDINGS: Gallbladder: No gallstones or wall thickening visualized. No sonographic Murphy sign noted  by sonographer. Common bile duct: Diameter: 3.5 mm, normal Liver: Diffusely increased hepatic parenchymal echotexture consistent with diffuse fatty infiltration. Portal vein is patent on color Doppler imaging with normal direction of blood flow towards the liver. IMPRESSION: No evidence of cholelithiasis or cholecystitis. Diffuse fatty infiltration of the liver. Electronically Signed   By: Burman NievesWilliam  Stevens M.D.   On: 11/29/2017 03:39    Procedures Procedures (including critical care time)  Medications Ordered in ED Medications  sucralfate (CARAFATE) tablet 1 g (has no administration in time range)  ondansetron (ZOFRAN-ODT) disintegrating tablet 4 mg (4 mg Oral Given 11/28/17 2200)  oxyCODONE-acetaminophen (PERCOCET/ROXICET) 5-325 MG per tablet 1 tablet (1 tablet Oral Given 11/29/17 0304)  oxyCODONE-acetaminophen (PERCOCET/ROXICET)  5-325 MG per tablet 1 tablet (1 tablet Oral Given 11/29/17 0159)  ondansetron (ZOFRAN-ODT) disintegrating tablet 8 mg (8 mg Oral Given 11/29/17 0305)     Initial Impression / Assessment and Plan / ED Course  I have reviewed the triage vital signs and the nursing notes.  Pertinent labs & imaging results that were available during my care of the patient were reviewed by me and considered in my medical decision making (see chart for details).  Clinical Course as of Nov 30 643  Thu Nov 29, 2017  0631 Ultrasound was negative, therefore we decided to get CT scan in the setting of elevated white count of 17.5. CT scan is normal. I do not think contrast was indicated, given that we do not think there is a vascular etiology or infectious etiology. Most likely patient is having severe peptic ulcer disease.  We will give her GI follow-up and start her on omeprazole.  CT ABDOMEN PELVIS WO CONTRAST [AN]  0645 Results from the ER workup discussed with the patient face to face and all questions answered to the best of my ability.  Strict ER return precautions have been discussed, and patient is agreeing with the plan and is comfortable with the workup done and the recommendations from the ER.    [AN]    Clinical Course User Index [AN] Derwood KaplanNanavati, Jahden Schara, MD    DDx includes: Pancreatitis Hepatobiliary pathology including cholecystitis Gastritis/PUD SBO ACS syndrome Aortic Dissection  37 year old female comes in a chief complaint of epigastric abdominal pain that started around 10 PM.  Patient's exam reveals epigastric and upper quadrant tenderness without guarding.  Patient does not have Murphy sign.  Labs look reassuring.  Ultrasound abdomen ordered which if negative we will proceed with CT scan.  Patient does not have any lower quadrant abdominal pain, flank pain and he did not think the pain is due to ovarian torsion or PID.  Final Clinical Impressions(s) / ED Diagnoses   Final diagnoses:  RUQ  pain  PUD (peptic ulcer disease)    ED Discharge Orders        Ordered    omeprazole (PRILOSEC) 20 MG capsule  Daily     11/29/17 0637    acetaminophen (TYLENOL) 325 MG tablet  Every 6 hours PRN     11/29/17 [id number]       Derwood KaplanNanavati, Kailiana Granquist, MD 11/29/17 [id number]    Derwood KaplanNanavati, Abdirahim Flavell, MD 11/29/17 [id number]    Derwood KaplanNanavati, Khoi Hamberger, MD 11/29/17 [id number]    Derwood KaplanNanavati, Adalyna Godbee, MD 11/29/17 [id number]

## 2017-11-29 NOTE — ED Notes (Signed)
To CT at this time.

## 2017-11-29 NOTE — ED Notes (Signed)
Pt taken to triage for "quicklook" process by EDP

## 2017-12-05 ENCOUNTER — Encounter: Payer: Self-pay | Admitting: Internal Medicine

## 2018-01-08 ENCOUNTER — Encounter: Payer: Self-pay | Admitting: *Deleted

## 2018-01-17 ENCOUNTER — Ambulatory Visit: Payer: Self-pay | Admitting: Internal Medicine

## 2018-02-11 ENCOUNTER — Emergency Department (HOSPITAL_COMMUNITY): Payer: Self-pay

## 2018-02-11 ENCOUNTER — Emergency Department (HOSPITAL_COMMUNITY)
Admission: EM | Admit: 2018-02-11 | Discharge: 2018-02-11 | Disposition: A | Discharge status: Home / Self Care | Payer: Self-pay | Attending: Emergency Medicine | Admitting: Emergency Medicine

## 2018-02-11 ENCOUNTER — Encounter (HOSPITAL_COMMUNITY): Payer: Self-pay | Admitting: Emergency Medicine

## 2018-02-11 DIAGNOSIS — R110 Nausea: Secondary | ICD-10-CM | POA: Insufficient documentation

## 2018-02-11 DIAGNOSIS — R1013 Epigastric pain: Secondary | ICD-10-CM | POA: Insufficient documentation

## 2018-02-11 DIAGNOSIS — R1011 Right upper quadrant pain: Secondary | ICD-10-CM | POA: Insufficient documentation

## 2018-02-11 DIAGNOSIS — N189 Chronic kidney disease, unspecified: Secondary | ICD-10-CM | POA: Insufficient documentation

## 2018-02-11 HISTORY — DX: Peptic ulcer, site unspecified, unspecified as acute or chronic, without hemorrhage or perforation: K27.9

## 2018-02-11 LAB — COMPREHENSIVE METABOLIC PANEL
ALBUMIN: 3.5 g/dL (ref 3.5–5.0)
ALT: 14 U/L (ref 0–44)
AST: 19 U/L (ref 15–41)
Alkaline Phosphatase: 65 U/L (ref 38–126)
Anion gap: 8 (ref 5–15)
BUN: 10 mg/dL (ref 6–20)
CO2: 23 mmol/L (ref 22–32)
CREATININE: 0.55 mg/dL (ref 0.44–1.00)
Calcium: 8.6 mg/dL — ABNORMAL LOW (ref 8.9–10.3)
Chloride: 108 mmol/L (ref 98–111)
GFR calc non Af Amer: >60 mL/min (ref >60)
GLUCOSE: 114 mg/dL — AB (ref 70–99)
Potassium: 3.5 mmol/L (ref 3.5–5.1)
SODIUM: 139 mmol/L (ref 135–145)
Total Bilirubin: 0.6 mg/dL (ref 0.3–1.2)
Total Protein: 6.6 g/dL (ref 6.5–8.1)

## 2018-02-11 LAB — LIPASE, BLOOD: LIPASE: 42 U/L (ref 11–51)

## 2018-02-11 LAB — URINALYSIS, ROUTINE W REFLEX MICROSCOPIC
Bilirubin Urine: NEGATIVE
Glucose, UA: NEGATIVE mg/dL
Hgb urine dipstick: NEGATIVE
Ketones, ur: NEGATIVE mg/dL
Leukocytes, UA: NEGATIVE
Nitrite: NEGATIVE
Protein, ur: NEGATIVE mg/dL
Specific Gravity, Urine: 1.02 (ref 1.005–1.030)
pH: 6 (ref 5.0–8.0)

## 2018-02-11 LAB — I-STAT BETA HCG BLOOD, ED (MC, WL, AP ONLY): I-stat hCG, quantitative: <5 m[IU]/mL

## 2018-02-11 LAB — CBC
HCT: 42.9 % (ref 36.0–46.0)
Hemoglobin: 13.8 g/dL (ref 12.0–15.0)
MCH: 28.8 pg (ref 26.0–34.0)
MCHC: 32.2 g/dL (ref 30.0–36.0)
MCV: 89.6 fL (ref 78.0–100.0)
Platelets: 201 K/uL (ref 150–400)
RBC: 4.79 MIL/uL (ref 3.87–5.11)
RDW: 12.4 % (ref 11.5–15.5)
WBC: 17.9 K/uL — ABNORMAL HIGH (ref 4.0–10.5)

## 2018-02-11 MED ORDER — ONDANSETRON HCL 4 MG/2ML IJ SOLN
4.0000 mg | Freq: Once | INTRAMUSCULAR | Status: AC
  Administered 2018-02-11: 4 mg via INTRAVENOUS
  Filled 2018-02-11: qty 2

## 2018-02-11 MED ORDER — SUCRALFATE 1 G PO TABS
1.0000 g | ORAL_TABLET | Freq: Once | ORAL | Status: AC
  Administered 2018-02-11: 1 g via ORAL
  Filled 2018-02-11: qty 1

## 2018-02-11 MED ORDER — FAMOTIDINE 20 MG PO TABS: 20.0000 mg | ORAL_TABLET | Freq: Two times a day (BID) | ORAL | 0 refills | Status: DC

## 2018-02-11 MED ORDER — SUCRALFATE 1 G PO TABS: 1.0000 g | ORAL_TABLET | Freq: Three times a day (TID) | ORAL | 0 refills | Status: DC

## 2018-02-11 MED ORDER — SODIUM CHLORIDE 0.9 % IV BOLUS
1000.0000 mL | Freq: Once | INTRAVENOUS | Status: AC
  Administered 2018-02-11: 1000 mL via INTRAVENOUS

## 2018-02-11 MED ORDER — ONDANSETRON 4 MG PO TBDP: 4.0000 mg | ORAL_TABLET | Freq: Three times a day (TID) | ORAL | 0 refills | Status: DC | PRN

## 2018-02-11 MED ORDER — FAMOTIDINE IN NACL 20-0.9 MG/50ML-% IV SOLN
20.0000 mg | INTRAVENOUS | Status: AC
  Administered 2018-02-11: 20 mg via INTRAVENOUS
  Filled 2018-02-11: qty 50

## 2018-02-11 MED ORDER — MORPHINE SULFATE (PF) 4 MG/ML IV SOLN
4.0000 mg | Freq: Once | INTRAVENOUS | Status: AC
  Administered 2018-02-11: 4 mg via INTRAVENOUS
  Filled 2018-02-11: qty 1

## 2018-02-11 NOTE — Discharge Instructions (Signed)
Take the prescribed medication as directed. °Follow-up with the patient care center-- call for appt. °Return to the ED for new or worsening symptoms. °

## 2018-02-11 NOTE — ED Notes (Signed)
Pt understood dc material. NAD noted. Scripts givne at [facility] [employer] interp used

## 2018-02-11 NOTE — ED Provider Notes (Signed)
[name] EMERGENCY DEPARTMENT Provider Note   CSN: [id number] Arrival date & time: 02/11/18  0051     History   Chief Complaint Chief Complaint  Patient presents with  . Abdominal Pain    HPI Nichole Jackson is a 37 y.o. female.  The history is provided by the patient and medical records. The history is limited by a language barrier. A language interpreter was used.    37 y.o. F with hx of CKD, fatty liver, HLP, HTN, PUD, presenting to the ED for abdominal pain.  States this started last evening after drinking coke and eating a spicy dinner.  States this pain does not happen every time she eats spicy food but does happen a lot.  States she has had similar pain in the past, seen here with negative imaging.  States she was told she may have stomach ulcers but has not had any GI follow-up.  She reports nausea but denies vomiting.  No fever or chills.  Denies any urinary symptoms, pelvic pain, or vaginal discharge.  She is status post C-section x3.  She has not had any medications prior to arrival.  Past Medical History:  Diagnosis Date  . Chronic kidney disease    stones  . Fatty liver   . Hyperlipidemia   . Hypertension   . No pertinent past medical history   . Peptic ulcer     Patient Active Problem List   Diagnosis Date Noted  . Chronic abdominal pain 01/26/2016  . Bilateral headaches 11/12/2015  . Preventative health care 11/12/2015  . Vitamin D deficiency 11/12/2015  . Screening 09/09/2014    Past Surgical History:  Procedure Laterality Date  . CESAREAN SECTION     x 2  . CESAREAN SECTION  07/27/2011   Procedure: CESAREAN SECTION;  Surgeon: Lesly Dukes, MD;  Location: [facility];  Service: Gynecology;  Laterality: N/A;  Previous classical     OB History    Gravida  3   Para  3   Term  3   Preterm      AB      Living  3     SAB      TAB      Ectopic      Multiple      Live Births  1            Home  Medications    Prior to Admission medications   Medication Sig Start Date End Date Taking? Authorizing Provider  acetaminophen (TYLENOL) 325 MG tablet Take 2 tablets (650 mg total) by mouth every 6 (six) hours as needed. 11/29/17   Derwood Kaplan, MD  omeprazole (PRILOSEC) 20 MG capsule Take 1 capsule (20 mg total) by mouth daily. 11/29/17   Derwood Kaplan, MD    Family History Family History  Problem Relation Age of Onset  . Hypertension Mother   . Diabetes Mother   . Hypertension Maternal Grandmother   . Diabetes Maternal Grandmother   . Heart disease Maternal Grandmother     Social History Social History   Tobacco Use  . Smoking status: Never Smoker  . Smokeless tobacco: Never Used  Substance Use Topics  . Alcohol use: No    Alcohol/week: 0.0 oz  . Drug use: No     Allergies   Patient has no known allergies.   Review of Systems Review of Systems  Gastrointestinal: Positive for abdominal pain and nausea.  All other systems reviewed and are negative.  Physical Exam Updated Vital Signs BP (!) 165/89 (BP Location: Right Arm)   Pulse 87   Temp 98.7 F (37.1 C) (Oral)   Resp 16   SpO2 100%   Physical Exam  Constitutional: She is oriented to person, place, and time. She appears well-developed and well-nourished.  HENT:  Head: Normocephalic and atraumatic.  Mouth/Throat: Oropharynx is clear and moist.  Eyes: Pupils are equal, round, and reactive to light. Conjunctivae and EOM are normal.  Neck: Normal range of motion.  Cardiovascular: Normal rate, regular rhythm and normal heart sounds.  Pulmonary/Chest: Effort normal and breath sounds normal.  Abdominal: Soft. Bowel sounds are normal. There is tenderness in the right upper quadrant, epigastric area and left upper quadrant. There is no rigidity and no guarding.  Musculoskeletal: Normal range of motion.  Neurological: She is alert and oriented to person, place, and time.  Skin: Skin is warm and dry.    Psychiatric: She has a normal mood and affect.  Nursing note and vitals reviewed.    ED Treatments / Results  Labs (all labs ordered are listed, but only abnormal results are displayed) Labs Reviewed  COMPREHENSIVE METABOLIC PANEL - Abnormal; Notable for the following components:      Result Value   Glucose, Bld 114 (*)    Calcium 8.6 (*)    All other components within normal limits  CBC - Abnormal; Notable for the following components:   WBC 17.9 (*)    All other components within normal limits  LIPASE, BLOOD  URINALYSIS, ROUTINE W REFLEX MICROSCOPIC  I-STAT BETA HCG BLOOD, ED ([facility], [facility], [facility] ONLY)    EKG None  Radiology US Abdomen Limited Ruq  Result Date: 02/11/2018 CLINICAL DATA:  Initial evaluation for acute right upper quadrant pain. EXAM: ULTRASOUND ABDOMEN LIMITED RIGHT UPPER QUADRANT COMPARISON:  Prior CT and ultrasound from 11/29/2017 FINDINGS: Gallbladder: Internal sludge seen within the gallbladder lumen without frank echogenic shadowing stones. Gallbladder wall measure within normal limits at 2.2 mm. No free pericholecystic fluid. No sonographic Murphy sign elicited on exam. Common bile duct: Diameter: 3.6 mm Liver: No focal lesion identified. Increased echogenicity within the liver parenchyma, suggesting steatosis. Portal vein is patent on color Doppler imaging with normal direction of blood flow towards the liver. IMPRESSION: 1. Gallbladder sludge without evidence for frank cholelithiasis or acute cholecystitis. 2. No biliary dilatation. 3. Increased hepatic echogenicity, suggesting steatosis. Electronically Signed   By: Rise Mu M.D.   On: 02/11/2018 04:34    Procedures Procedures (including critical care time)  Medications Ordered in ED Medications - No data to display   Initial Impression / Assessment and Plan / ED Course  I have reviewed the triage vital signs and the nursing notes.  Pertinent labs & imaging results that were available during my  care of the patient were reviewed by me and considered in my medical decision making (see chart for details).  37 year old here with abdominal pain.  This began after drinking Coca-Cola and eating spicy food last night.  Has history of peptic ulcer disease.  Has been seen here previously for same.  Patient is afebrile and nontoxic.  She points to her right upper and epigastrium as areas of pain.  Does have some tenderness locally but no peritoneal signs.  She has normal bowel sounds.  Screening labs reviewed, does have leukocytosis, very similar values compared with prior ER visits.  We will plan for ultrasound.  IV fluids and medications given.  Ultrasound with gallbladder sludge but no  noted stones.  Patient feeling better after meds. No further vomiting, tolerating PO well.  Discussed results with her via language interpreter.  She acknowledged understanding.  Suspect this is flare up of her PUD/GERD.  Will start pepcid, carafate, zofran.  Discussed diet modification including limiting spicy/acidic foods, etc.  Referred to patient care center for follow-up.  Discussed plan with patient via language interpreter, she acknowledged understanding and agreed with plan of care.  Return precautions given for new or worsening symptoms.  Final Clinical Impressions(s) / ED Diagnoses   Final diagnoses:  RUQ pain  Epigastric pain  Nausea    ED Discharge Orders        Ordered    famotidine (PEPCID) 20 MG tablet  2 times daily     02/11/18 0544    sucralfate (CARAFATE) 1 g tablet  3 times daily with meals & bedtime     02/11/18 0544    ondansetron (ZOFRAN ODT) 4 MG disintegrating tablet  Every 8 hours PRN     02/11/18 0544       Garlon HatchetSanders, Tripp Goins M, PA-C 02/11/18 0550    Nira Connardama, Pedro Eduardo, MD 02/11/18 [phone]

## 2018-02-11 NOTE — ED Triage Notes (Signed)
Pt reports RLQ abd pain X1 day with nausea. Pt has hx of peptic ulcer disease.

## 2018-02-25 ENCOUNTER — Ambulatory Visit: Payer: Self-pay

## 2018-02-25 ENCOUNTER — Ambulatory Visit: Payer: Self-pay | Attending: Physician Assistant | Admitting: Physician Assistant

## 2018-02-25 VITALS — BP 113/72 | HR 54 | Temp 98.2°F | Resp 18 | Ht 64.0 in | Wt 192.0 lb

## 2018-02-25 DIAGNOSIS — R1013 Epigastric pain: Secondary | ICD-10-CM

## 2018-02-25 DIAGNOSIS — I10 Essential (primary) hypertension: Secondary | ICD-10-CM | POA: Insufficient documentation

## 2018-02-25 DIAGNOSIS — R109 Unspecified abdominal pain: Secondary | ICD-10-CM | POA: Insufficient documentation

## 2018-02-25 DIAGNOSIS — E785 Hyperlipidemia, unspecified: Secondary | ICD-10-CM | POA: Insufficient documentation

## 2018-02-25 NOTE — Progress Notes (Signed)
Nichole Jackson  ZOX:[id number]  [id number]  DOB - March 13, 1981  Chief Complaint  Patient presents with  . Hospitalization Follow-up       Subjective:   Nichole Jackson is a 37 y.o. female here today for establishment of care.  She has a past medical history of kidney stones, hyperlipidemia, hypertension and fatty liver.  She has had 3 C-sections.  She presented to [facility] emergency department on 02/11/2018 with complaints of abdominal pain.  The night before she had drank a Coca-Cola and ate some spicy food which is kind of unusual for her.  She did have some nausea but no vomiting.  Her blood pressure was elevated in the emergency department.  She had some right upper quadrant tenderness.  Her white blood cell count was 17,000 but this was not new.  She had an ultrasound of her gallbladder which showed some sludge but no evidence of cholelithiasis or cholecystitis.  There was some hepatic steatosis.  She was treated with IV fluids.  At discharge she was given Pepcid, Carafate and Zofran.    She states that after 1 week of taking his medications consistently she had resolution of her symptoms.  Rarely she will have some right-sided pain near the abdominal area.  She states her menstrual cycles are regular and she is menstruating currently.  She had a Pap smear about 1 year ago.  No nausea or vomiting.  No diarrhea or constipation.  Overall feeling much better.  ROS: GEN: denies fever or chills, denies change in weight Skin: denies lesions or rashes HEENT: denies headache, earache, epistaxis, sore throat, or neck pain LUNGS: denies SHOB, dyspnea, PND, orthopnea CV: denies CP or palpitations ABD: denies abd pain, N or V EXT: denies muscle spasms or swelling; no pain in lower ext, no weakness NEURO: denies numbness or tingling, denies sz, stroke or TIA  ALLERGIES: No Known Allergies  PAST MEDICAL HISTORY: Past Medical History:  Diagnosis Date  . Chronic kidney  disease    stones  . Fatty liver   . Hyperlipidemia   . Hypertension   . No pertinent past medical history   . Peptic ulcer     PAST SURGICAL HISTORY: Past Surgical History:  Procedure Laterality Date  . CESAREAN SECTION     x 2  . CESAREAN SECTION  07/27/2011   Procedure: CESAREAN SECTION;  Surgeon: Lesly DukesKelly H. Leggett, MD;  Location: [facility];  Service: Gynecology;  Laterality: N/A;  Previous classical    MEDICATIONS AT HOME: Prior to Admission medications   Medication Sig Start Date End Date Taking? Authorizing Provider  acetaminophen (TYLENOL) 325 MG tablet Take 2 tablets (650 mg total) by mouth every 6 (six) hours as needed. 11/29/17  Yes Derwood KaplanNanavati, Ankit, MD  famotidine (PEPCID) 20 MG tablet Take 1 tablet (20 mg total) by mouth 2 (two) times daily. 02/11/18  Yes Garlon HatchetSanders, Lisa M, PA-C  omeprazole (PRILOSEC) 20 MG capsule Take 1 capsule (20 mg total) by mouth daily. 11/29/17  Yes Nanavati, Ankit, MD  ondansetron (ZOFRAN ODT) 4 MG disintegrating tablet Take 1 tablet (4 mg total) by mouth every 8 (eight) hours as needed for nausea. 02/11/18  Yes Garlon HatchetSanders, Lisa M, PA-C  sucralfate (CARAFATE) 1 g tablet Take 1 tablet (1 g total) by mouth 4 (four) times daily -  with meals and at bedtime. 02/11/18  Yes Garlon HatchetSanders, Lisa M, PA-C    Family History  Problem Relation Age of Onset  . Hypertension Mother   . Diabetes Mother   .  Hypertension Maternal Grandmother   . Diabetes Maternal Grandmother   . Heart disease Maternal Grandmother    Social-married, 3 children, housekeeper, no smoking  Objective:   Vitals:   02/25/18 1017  BP: 113/72  Pulse: (!) 54  Resp: 18  Temp: 98.2 F (36.8 C)  TempSrc: Oral  SpO2: 99%  Weight: 192 lb (87.1 kg)  Height: 5\' 4"  (1.626 m)    Exam General appearance : Awake, alert, not in any distress. Speech Clear. Not toxic looking HEENT: Atraumatic and Normocephalic, pupils equally reactive to light and accomodation Neck: supple, no JVD. No cervical  lymphadenopathy.  Chest:Good air entry bilaterally, no added sounds  CVS: S1 S2 regular, no murmurs.  Abdomen: Bowel sounds present, Non tender and not distended with no guarding, rigidity or rebound. Extremities: B/L Lower Ext shows no edema, both legs are warm to touch Neurology: Awake alert, and oriented X 3, CN II-XII intact, Non focal Skin:No Rash Wounds:N/A  Data Review Lab Results  Component Value Date   HGBA1C 5.5 09/09/2014     Assessment & Plan  1. Abdominal Pain ? etiology  -cont supportive care Carafate, pepcid, zofran as needed  -avoid triggers  Appt with financial counselor Return in about 8 weeks (around 04/22/2018).  The patient was given clear instructions to go to ER or return to medical center if symptoms don't improve, worsen or new problems develop. The patient verbalized understanding. The patient was told to call to get lab results if they haven't heard anything in the next week.   Total time spent with patient was 32 min.. Greater than 50 % of this visit was spent face to face counseling and coordinating care regarding risk factor modification, compliance importance and encouragement, education related to sxs and ED visit.  This note has been created with Education officer, environmental. Any transcriptional errors are unintentional.    Scot Jun, PA-C [facility] [place], [place] [phone]   02/25/2018, 10:39 AM

## 2018-03-06 ENCOUNTER — Ambulatory Visit: Payer: Self-pay | Attending: Physician Assistant | Admitting: Physician Assistant

## 2018-03-06 VITALS — BP 118/75 | HR 70 | Temp 99.0°F | Resp 18 | Ht 65.0 in | Wt 195.0 lb

## 2018-03-06 DIAGNOSIS — R1013 Epigastric pain: Secondary | ICD-10-CM | POA: Insufficient documentation

## 2018-03-06 DIAGNOSIS — Z8711 Personal history of peptic ulcer disease: Secondary | ICD-10-CM | POA: Insufficient documentation

## 2018-03-06 DIAGNOSIS — E785 Hyperlipidemia, unspecified: Secondary | ICD-10-CM | POA: Insufficient documentation

## 2018-03-06 DIAGNOSIS — R519 Headache, unspecified: Secondary | ICD-10-CM

## 2018-03-06 DIAGNOSIS — Z79899 Other long term (current) drug therapy: Secondary | ICD-10-CM | POA: Insufficient documentation

## 2018-03-06 DIAGNOSIS — N189 Chronic kidney disease, unspecified: Secondary | ICD-10-CM | POA: Insufficient documentation

## 2018-03-06 DIAGNOSIS — Z789 Other specified health status: Secondary | ICD-10-CM

## 2018-03-06 DIAGNOSIS — K760 Fatty (change of) liver, not elsewhere classified: Secondary | ICD-10-CM | POA: Insufficient documentation

## 2018-03-06 DIAGNOSIS — R110 Nausea: Secondary | ICD-10-CM | POA: Insufficient documentation

## 2018-03-06 DIAGNOSIS — R51 Headache: Secondary | ICD-10-CM | POA: Insufficient documentation

## 2018-03-06 DIAGNOSIS — I129 Hypertensive chronic kidney disease with stage 1 through stage 4 chronic kidney disease, or unspecified chronic kidney disease: Secondary | ICD-10-CM | POA: Insufficient documentation

## 2018-03-06 MED ORDER — NAPROXEN 500 MG PO TABS: 500.0000 mg | ORAL_TABLET | Freq: Two times a day (BID) | ORAL | 0 refills | Status: AC

## 2018-03-06 MED ORDER — ONDANSETRON HCL 8 MG PO TABS: 8.0000 mg | ORAL_TABLET | Freq: Three times a day (TID) | ORAL | 0 refills | Status: DC | PRN

## 2018-03-06 MED FILL — NAPROXEN 500 MG TABLET: 500 | 60 days supply | Qty: 60 | Fill #0

## 2018-03-06 NOTE — Progress Notes (Signed)
Patient ID: Nichole Jackson, female   DOB: Jan 03, 1981, 37 y.o.   MRN: [id number]       Nichole Jackson, is a 37 y.o. female  NFA:[id number]  ION:[id number]  DOB - [birth year]  Subjective:  Chief Complaint and HPI: Nichole Jackson is a 37 y.o. female here  HA for 1 week and last night she also had nausea last night.  advil helped some.  No precipitating factors.  Total head hurts. No vision changes. LMP last week. No new activities.  No vomiting.    Seen in ED not long ago for RUQ pain.  Gallbladder U/S showed sludge and fatty liver-no definite stones.  She has intermittent pain.  No sever.  No f/c.    Leukocytosis appears ongoing in labs.  PCP to follow.   Christian with [employer] interpreters translating. ED/Hospital notes reviewed.   FH:  No aneurysms/strokes  ROS:   Constitutional:  No f/c, No night sweats, No unexplained weight loss. EENT:  No vision changes, No blurry vision, No hearing changes. No mouth, throat, or ear problems.  Respiratory: No cough, No SOB Cardiac: No CP, no palpitations GI:  No abd pain, no V/D. Nausea last night only GU: No Urinary s/sx Musculoskeletal: No joint pain Neuro: + headache, no dizziness, no motor weakness.  Skin: No rash Endocrine:  No polydipsia. No polyuria.  Psych: Denies SI/HI  No problems updated.  ALLERGIES: No Known Allergies  PAST MEDICAL HISTORY: Past Medical History:  Diagnosis Date  . Chronic kidney disease    stones  . Fatty liver   . Hyperlipidemia   . Hypertension   . No pertinent past medical history   . Peptic ulcer     MEDICATIONS AT HOME: Prior to Admission medications   Medication Sig Start Date End Date Taking? Authorizing Provider  acetaminophen (TYLENOL) 325 MG tablet Take 2 tablets (650 mg total) by mouth every 6 (six) hours as needed. 11/29/17  Yes Derwood Kaplan, MD  famotidine (PEPCID) 20 MG tablet Take 1 tablet (20 mg total) by mouth 2 (two) times daily. 02/11/18  Yes Garlon Hatchet, PA-C  omeprazole (PRILOSEC) 20 MG capsule Take 1 capsule (20 mg total) by mouth daily. 11/29/17  Yes Nanavati, Ankit, MD  ondansetron (ZOFRAN ODT) 4 MG disintegrating tablet Take 1 tablet (4 mg total) by mouth every 8 (eight) hours as needed for nausea. 02/11/18  Yes Garlon Hatchet, PA-C  sucralfate (CARAFATE) 1 g tablet Take 1 tablet (1 g total) by mouth 4 (four) times daily -  with meals and at bedtime. 02/11/18  Yes Garlon Hatchet, PA-C  naproxen (NAPROSYN) 500 MG tablet Take 1 tablet (500 mg total) by mouth 2 (two) times daily with a meal. Prn pain 03/06/18   Anders Simmonds, PA-C  ondansetron (ZOFRAN) 8 MG tablet Take 1 tablet (8 mg total) by mouth every 8 (eight) hours as needed for nausea or vomiting. 03/06/18   Anders Simmonds, PA-C     Objective:  EXAM:   Vitals:   03/06/18 1620  BP: 118/75  Pulse: 70  Resp: 18  Temp: 99 F (37.2 C)  TempSrc: Oral  SpO2: 97%  Weight: 195 lb (88.5 kg)  Height: 5\' 5"  (1.651 m)    General appearance : A&OX3. NAD. Non-toxic-appearing HEENT: Atraumatic and Normocephalic.  PERRLA. EOM intact.  TM clear B. Mouth-MMM, post pharynx WNL w/o erythema, No PND. Neck: supple, no JVD. No cervical lymphadenopathy. No thyromegaly Chest/Lungs:  Breathing-non-labored, Good air entry bilaterally, breath sounds  normal without rales, rhonchi, or wheezing  CVS: S1 S2 regular, no murmurs, gallops, rubs  Extremities: Bilateral Lower Ext shows no edema, both legs are warm to touch with = pulse throughout Neurology:  CN II-XII grossly intact, Non focal.   Psych:  TP linear. J/I WNL. Normal speech. Appropriate eye contact and affect.  Skin:  No Rash  Data Review Lab Results  Component Value Date   HGBA1C 5.5 09/09/2014     Assessment & Plan   1. Acute nonintractable headache, unspecified headache type No red flags today.  Normal neuro exam.   - naproxen (NAPROSYN) 500 MG tablet; Take 1 tablet (500 mg total) by mouth 2 (two) times daily with a meal. Prn  pain  Dispense: 60 tablet; Refill: 0 - TSH - Vitamin D, 25-hydroxy  2. Epigastric pain Fatty liver-gave gallbladder diet info  3. Nausea - ondansetron (ZOFRAN) 8 MG tablet; Take 1 tablet (8 mg total) by mouth every 8 (eight) hours as needed for nausea or vomiting.  Dispense: 20 tablet; Refill: 0  4. Language barrier [employer] interpreters used and additional time performing visit was required.      Patient have been counseled extensively about nutrition and exercise  Return for keep 04/26/2018 appt.  The patient was given clear instructions to go to ER or return to medical center if symptoms don't improve, worsen or new problems develop. The patient verbalized understanding. The patient was told to call to get lab results if they haven't heard anything in the next week.     Georgian CoAngela Brentlee Sciara, PA-C [facility] [place], [place] [phone]   03/06/2018, 4:50 PM

## 2018-03-06 NOTE — Patient Instructions (Addendum)
Drink 80-100 ounces water daily  Dieta con bajo contenido de grasas para la pancreatitis o los trastornos de la vescula (Low-Fat Diet for Pancreatitis or Gallbladder Conditions) Una dieta con bajo contenido de grasas puede ser til si usted tiene pancreatitis o trastornos de la vescula. En estos trastornos, el pncreas y la vescula tienen dificultades para digerir las grasas. Un plan de alimentacin saludable con menos grasas ayudar a que el pncreas y la vescula descansen, y reducir los sntomas. QU DEBO SABER ACERCA DE ESTA DIETA?  Consuma una dieta con bajo contenido de [place]. ? Reduzca el consumo de grasas a menos del 20% al 30% del total de caloras diarias. Esto representa menos de 50a 60gramos de grasas por da. ? Recuerde que la dieta debe incluir algo de grasa. Consulte al nutricionista cul debe ser su meta diaria. ? Elija alimentos saludables sin grasas o con bajo contenido de grasas. Busque las palabras "sin grasa", "bajo en grasas" o "bajo contenido de grasas". ? Energy East CorporationComo gua, mire la etiqueta y elija alimentos con menos de 3gramos de grasa por porcin. Coma solo una porcin.  Evite el alcohol.  No fumar. Si necesita ayuda para dejar de fumar, hable con el mdico.  Haga comidas pequeas y frecuentes en lugar de 3 comidas abundantes y pesadas.  QU ALIMENTOS PUEDO COMER? Cereales Incluya granos y almidones saludables, como papas, pan integral, cereales ricos en fibras y Harlon Dittyarroz integral. Elija opciones de cereales integrales siempre que sea posible. En los adultos, los cereales integrales deben representar del 45% al 65% de las caloras diarias. Nils PyleFrutas y verduras Coma muchas frutas y verduras. Las frutas y verduras frescas aportan fibra a la dieta. Carnes y otras fuentes de protenas Coma carnes [place], como pollo y cerdo. Quite la grasa de la carne antes de cocinarla. Los Moorheadhuevos, el pescado y los frijoles son otras fuentes de protenas. En los adultos, estos  alimentos deben representar entre el 10% y el 35% de las caloras diarias. Lcteos Northeast UtilitiesElija leche y otros productos lcteos con bajo contenido de [place]grasas. Los lcteos aportan grasas y protenas, adems de calcio. Grasas y Allstateaceites Limite los alimentos con alto contenido de grasas, como las comidas fritas, los Moodydulces, los productos horneados y las bebidas azucaradas. Otros Los condimentos y las salsas cremosas, como la [place]onesa, [place]ueden aportar grasa adicional. Piense si necesita usarlos o no, o use pequeas cantidades u opciones con bajo contenido de grasas. QU ALIMENTOS NO ESTN RECOMENDADOS?  Los alimentos con alto contenido de [place], por ejemplo: ? Alimentos horneados. ? Helados. ? Tostadas francesas. ? Panecillos dulces. ? Pizza. ? Pan de queso. ? Alimentos rebozados o cubiertos con Counce[place]equilla, salsas cremosas o queso. ? Comidas fritas. ? Postres y bebidas azucaradas.  Alimentos que causan gases o meteorismo  Esta informacin no tiene Theme park managercomo fin reemplazar el consejo del mdico. Asegrese de hacerle al mdico cualquier pregunta que tenga. Document Released: 07/29/2013 Document Revised: 07/29/2013 Document Reviewed: 07/07/2013 Elsevier Interactive Patient Education  2017 [employer]

## 2018-03-07 ENCOUNTER — Other Ambulatory Visit: Payer: Self-pay | Admitting: Physician Assistant

## 2018-03-07 LAB — TSH: TSH: 1.65 u[IU]/mL (ref 0.450–4.500)

## 2018-03-07 LAB — VITAMIN D 25 HYDROXY (VIT D DEFICIENCY, FRACTURES): Vit D, 25-Hydroxy: 8.6 ng/mL — ABNORMAL LOW (ref 30.0–100.0)

## 2018-03-07 MED ORDER — VITAMIN D (ERGOCALCIFEROL) 1.25 MG (50000 UNIT) PO CAPS: 50000.0000 [IU] | ORAL_CAPSULE | ORAL | 0 refills | Status: DC

## 2018-03-07 MED FILL — VIT D2 1.25 MG (50,000 UNIT: 1.25 MG | 28 days supply | Qty: 4 | Fill #0

## 2018-03-12 ENCOUNTER — Telehealth: Payer: Self-pay | Admitting: *Deleted

## 2018-03-12 NOTE — Telephone Encounter (Signed)
-----   Message from Anders SimmondsAngela M McClung, [place] JerseyPA-C sent at 03/07/2018  8:37 AM EDT ----- Please call patient and let them that their vitamin D is low.  This can contribute to muscle aches, anxiety, fatigue, and depression.  I have sent a prescription to our pharmacy for them to take once a week.  We will recheck this level in 3-4 months.  Her thyroid study was normal.  Follow-up as planned.  Thanks, Georgian CoAngela McClung, PA-C

## 2018-03-12 NOTE — Telephone Encounter (Signed)
Medical Assistant used [employer] to contact patient.  Interpreter Name: Docia Furlloy Interpreter #:[id number] Patient verified DOB Patient is aware of vitamin d being low and needing to take the weekly supplement and complete a recheck in 3-4 months. Patient is also aware of thyroid being normal and to follow up as planned. No further questions.

## 2018-04-26 ENCOUNTER — Ambulatory Visit: Payer: Self-pay | Attending: Nurse Practitioner | Admitting: Nurse Practitioner

## 2018-04-26 ENCOUNTER — Encounter: Payer: Self-pay | Admitting: Nurse Practitioner

## 2018-04-26 VITALS — BP 133/83 | HR 63 | Temp 98.5°F | Ht 65.0 in | Wt 198.0 lb

## 2018-04-26 DIAGNOSIS — Z8711 Personal history of peptic ulcer disease: Secondary | ICD-10-CM | POA: Insufficient documentation

## 2018-04-26 DIAGNOSIS — E785 Hyperlipidemia, unspecified: Secondary | ICD-10-CM | POA: Insufficient documentation

## 2018-04-26 DIAGNOSIS — Z79899 Other long term (current) drug therapy: Secondary | ICD-10-CM | POA: Insufficient documentation

## 2018-04-26 DIAGNOSIS — E559 Vitamin D deficiency, unspecified: Secondary | ICD-10-CM | POA: Insufficient documentation

## 2018-04-26 DIAGNOSIS — Z8249 Family history of ischemic heart disease and other diseases of the circulatory system: Secondary | ICD-10-CM | POA: Insufficient documentation

## 2018-04-26 DIAGNOSIS — I129 Hypertensive chronic kidney disease with stage 1 through stage 4 chronic kidney disease, or unspecified chronic kidney disease: Secondary | ICD-10-CM | POA: Insufficient documentation

## 2018-04-26 DIAGNOSIS — N189 Chronic kidney disease, unspecified: Secondary | ICD-10-CM | POA: Insufficient documentation

## 2018-04-26 MED ORDER — VITAMIN D (ERGOCALCIFEROL) 1.25 MG (50000 UNIT) PO CAPS: 50000.0000 [IU] | ORAL_CAPSULE | ORAL | 1 refills | Status: AC

## 2018-04-26 MED FILL — VIT D2 1.25 MG (50,000 UNIT: 1.25 MG | 28 days supply | Qty: 4 | Fill #1

## 2018-04-26 NOTE — Progress Notes (Signed)
Assessment & Plan:  [name] was seen today for establish care.  Diagnoses and all orders for this visit:  Vitamin D deficiency -     Vitamin D, Ergocalciferol, (DRISDOL) 50000 units CAPS capsule; Take 1 capsule (50,000 Units total) by mouth every 7 (seven) days.   Patient has been counseled on age-appropriate routine health concerns for screening and prevention. These are reviewed and up-to-date. Referrals have been placed accordingly. Immunizations are up-to-date or declined.    Subjective:   Chief Complaint  Patient presents with  . Establish Care    Pt. is here to here to establish care.    HPI Nichole Jackson 38 y.o. female presents to office today to establish care. VRI was used to communicate directly with patient for the entire encounter including providing detailed patient instructions. She has a history of PUD and HPL.  However today she reports she no longer takes famotidine or omeprazole.  She denies any PUD/GERD symptoms. Will continue to monitor. Fasting lipid panel to be ordered at next appointment for physical.   Review of Systems  Constitutional: Negative for fever, malaise/fatigue and weight loss.  HENT: Negative.  Negative for nosebleeds.   Eyes: Negative.  Negative for blurred vision, double vision and photophobia.  Respiratory: Negative.  Negative for cough and shortness of breath.   Cardiovascular: Negative.  Negative for chest pain, palpitations and leg swelling.  Gastrointestinal: Negative.  Negative for heartburn, nausea and vomiting.  Musculoskeletal: Negative.  Negative for myalgias.  Neurological: Negative.  Negative for dizziness, focal weakness, seizures and headaches.  Psychiatric/Behavioral: Negative.  Negative for suicidal ideas.    Past Medical History:  Diagnosis Date  . Chronic kidney disease    stones  . Fatty liver   . Hyperlipidemia   . Hypertension   . No pertinent past medical history   . Peptic ulcer     Past Surgical  History:  Procedure Laterality Date  . CESAREAN SECTION     x 2  . CESAREAN SECTION  07/27/2011   Procedure: CESAREAN SECTION;  Surgeon: Lesly Dukes, MD;  Location: [facility];  Service: Gynecology;  Laterality: N/A;  Previous classical    Family History  Problem Relation Age of Onset  . Hypertension Mother   . Diabetes Mother   . Hypertension Maternal Grandmother   . Diabetes Maternal Grandmother   . Heart disease Maternal Grandmother     Social History Reviewed with no changes to be made today.   Outpatient Medications Prior to Visit  Medication Sig Dispense Refill  . naproxen (NAPROSYN) 500 MG tablet Take 1 tablet (500 mg total) by mouth 2 (two) times daily with a meal. Prn pain 60 tablet 0  . acetaminophen (TYLENOL) 325 MG tablet Take 2 tablets (650 mg total) by mouth every 6 (six) hours as needed. (Patient not taking: Reported on 04/26/2018) 30 tablet 0  . famotidine (PEPCID) 20 MG tablet Take 1 tablet (20 mg total) by mouth 2 (two) times daily. (Patient not taking: Reported on 04/26/2018) 30 tablet 0  . omeprazole (PRILOSEC) 20 MG capsule Take 1 capsule (20 mg total) by mouth daily. (Patient not taking: Reported on 04/26/2018) 60 capsule 0  . ondansetron (ZOFRAN ODT) 4 MG disintegrating tablet Take 1 tablet (4 mg total) by mouth every 8 (eight) hours as needed for nausea. (Patient not taking: Reported on 04/26/2018) 10 tablet 0  . ondansetron (ZOFRAN) 8 MG tablet Take 1 tablet (8 mg total) by mouth every 8 (eight) hours as needed  for nausea or vomiting. (Patient not taking: Reported on 04/26/2018) 20 tablet 0  . sucralfate (CARAFATE) 1 g tablet Take 1 tablet (1 g total) by mouth 4 (four) times daily -  with meals and at bedtime. (Patient not taking: Reported on 04/26/2018) 40 tablet 0  . Vitamin D, Ergocalciferol, (DRISDOL) 50000 units CAPS capsule Take 1 capsule (50,000 Units total) by mouth every 7 (seven) days. (Patient not taking: Reported on 04/26/2018) 16 capsule 0   No  facility-administered medications prior to visit.     No Known Allergies     Objective:    BP 133/83 (BP Location: Right Arm, Patient Position: Sitting, Cuff Size: Normal)   Pulse 63   Temp 98.5 F (36.9 C) (Oral)   Ht 5\' 5"  (1.651 m)   Wt 198 lb (89.8 kg)   LMP  (LMP Unknown)   SpO2 95%   BMI 32.95 kg/m  Wt Readings from Last 3 Encounters:  04/26/18 198 lb (89.8 kg)  03/06/18 195 lb (88.5 kg)  02/25/18 192 lb (87.1 kg)    Physical Exam  Constitutional: She is oriented to person, place, and time. She appears well-developed and well-nourished. She is cooperative.  HENT:  Head: Normocephalic and atraumatic.  Eyes: EOM are normal.  Neck: Normal range of motion.  Cardiovascular: Normal rate, regular rhythm, normal heart sounds and intact distal pulses. Exam reveals no gallop and no friction rub.  No murmur heard. Pulmonary/Chest: Effort normal and breath sounds normal. No tachypnea. No respiratory distress. She has no decreased breath sounds. She has no wheezes. She has no rhonchi. She has no rales. She exhibits no tenderness.  Abdominal: Soft. Bowel sounds are normal.  Musculoskeletal: Normal range of motion. She exhibits no edema.  Neurological: She is alert and oriented to person, place, and time. Coordination normal.  Skin: Skin is warm and dry.  Psychiatric: She has a normal mood and affect. Her behavior is normal. Judgment and thought content normal.  Nursing note and vitals reviewed.      Patient has been counseled extensively about nutrition and exercise as well as the importance of adherence with medications and regular follow-up. The patient was given clear instructions to go to ER or return to medical center if symptoms don't improve, worsen or new problems develop. The patient verbalized understanding.   Follow-up: Return for schedule physical in December .   Claiborne RiggZelda W Tian Davison, FNP-BC [facility] [place], [place] [phone]     04/26/2018, 10:10 AM

## 2018-05-21 MED FILL — VIT D2 1.25 MG (50,000 UNIT: 1.25 MG | 28 days supply | Qty: 4 | Fill #2

## 2018-06-20 MED FILL — VIT D2 1.25 MG (50,000 UNIT: 1.25 MG | 28 days supply | Qty: 4 | Fill #3

## 2018-07-26 ENCOUNTER — Ambulatory Visit: Payer: Self-pay | Attending: Nurse Practitioner | Admitting: Nurse Practitioner

## 2018-07-26 ENCOUNTER — Encounter: Payer: Self-pay | Admitting: Nurse Practitioner

## 2018-07-26 VITALS — BP 124/76 | HR 74 | Temp 98.5°F | Resp 18 | Ht 65.0 in | Wt 203.0 lb

## 2018-07-26 DIAGNOSIS — Z0000 Encounter for general adult medical examination without abnormal findings: Secondary | ICD-10-CM

## 2018-07-26 NOTE — Progress Notes (Signed)
Assessment & Plan:  Nichole Jackson was seen today for annual exam.  Diagnoses and all orders for this visit:  Well woman exam without gynecological exam -     CBC -     CMP14+EGFR -     Lipid panel -     VITAMIN D 25 Hydroxy (Vit-D Deficiency, Fractures)    Patient has been counseled on age-appropriate routine health concerns for screening and prevention. These are reviewed and up-to-date. Referrals have been placed accordingly. Immunizations are up-to-date or declined.    Subjective:   Chief Complaint  Patient presents with  . Annual Exam   HPI Nichole Jackson 37 y.o. female presents to office today   Review of Systems  Constitutional: Negative.  Negative for chills, fever, malaise/fatigue and weight loss.  HENT: Negative.  Negative for congestion, hearing loss, sinus pain and sore throat.   Eyes: Negative.  Negative for blurred vision, double vision, photophobia and pain.  Respiratory: Negative.  Negative for cough, sputum production, shortness of breath and wheezing.   Cardiovascular: Negative.  Negative for chest pain and leg swelling.  Gastrointestinal: Negative.  Negative for abdominal pain, constipation, diarrhea, heartburn, nausea and vomiting.  Genitourinary: Negative.   Musculoskeletal: Negative.  Negative for joint pain and myalgias.  Skin: Negative.  Negative for rash.  Neurological: Negative.  Negative for dizziness, tremors, speech change, focal weakness, seizures and headaches.  Endo/Heme/Allergies: Negative.  Negative for environmental allergies.  Psychiatric/Behavioral: Negative.  Negative for depression and suicidal ideas. The patient is not nervous/anxious and does not have insomnia.     Past Medical History:  Diagnosis Date  . Chronic kidney disease    stones  . Fatty liver   . Hyperlipidemia   . Hypertension   . No pertinent past medical history   . Peptic ulcer     Past Surgical History:  Procedure Laterality Date  . CESAREAN SECTION     x 2   . CESAREAN SECTION  07/27/2011   Procedure: CESAREAN SECTION;  Surgeon: Guss Bunde, MD;  Location: [facility];  Service: Gynecology;  Laterality: N/A;  Previous classical    Family History  Problem Relation Age of Onset  . Hypertension Mother   . Diabetes Mother   . Hypertension Maternal Grandmother   . Diabetes Maternal Grandmother   . Heart disease Maternal Grandmother     Social History Reviewed with no changes to be made today.   Outpatient Medications Prior to Visit  Medication Sig Dispense Refill  . naproxen (NAPROSYN) 500 MG tablet Take 1 tablet (500 mg total) by mouth 2 (two) times daily with a meal. Prn pain (Patient not taking: Reported on 07/26/2018) 60 tablet 0   No facility-administered medications prior to visit.     No Known Allergies     Objective:    BP 124/76 (BP Location: Left Arm, Patient Position: Sitting, Cuff Size: Large)   Pulse 74   Temp 98.5 F (36.9 C) (Oral)   Resp 18   Ht '5\' 5"'$  (1.651 m)   Wt 203 lb (92.1 kg)   LMP 06/26/2018   SpO2 99%   BMI 33.78 kg/m  Wt Readings from Last 3 Encounters:  07/26/18 203 lb (92.1 kg)  04/26/18 198 lb (89.8 kg)  03/06/18 195 lb (88.5 kg)    Physical Exam Constitutional:      Appearance: She is well-developed.  HENT:     Head: Normocephalic and atraumatic.     Right Ear: External ear normal.  Left Ear: External ear normal.     Nose: Nose normal.     Mouth/Throat:     Pharynx: No oropharyngeal exudate.  Eyes:     General: No scleral icterus.       Right eye: No discharge.     Conjunctiva/sclera: Conjunctivae normal.     Pupils: Pupils are equal, round, and reactive to light.  Neck:     Musculoskeletal: Normal range of motion and neck supple.     Thyroid: No thyromegaly.     Trachea: No tracheal deviation.  Cardiovascular:     Rate and Rhythm: Normal rate and regular rhythm.     Pulses:          Dorsalis pedis pulses are 2+ on the right side and 2+ on the left side.       Posterior  tibial pulses are 2+ on the right side and 2+ on the left side.     Heart sounds: Normal heart sounds. No murmur. No friction rub. No gallop.   Pulmonary:     Effort: Pulmonary effort is normal. No accessory muscle usage or respiratory distress.     Breath sounds: Normal breath sounds. No decreased breath sounds, wheezing, rhonchi or rales.  Chest:     Chest wall: No mass or tenderness.     Breasts: Breasts are symmetrical.        Right: Normal. No inverted nipple, mass, nipple discharge, skin change or tenderness.        Left: Normal. No inverted nipple, mass, nipple discharge, skin change or tenderness.  Abdominal:     General: Bowel sounds are normal. There is no distension.     Palpations: Abdomen is soft. There is no mass.     Tenderness: There is no abdominal tenderness. There is no guarding or rebound.     Hernia: There is no hernia in the right inguinal area or left inguinal area.  Genitourinary:    Comments: GYN DEFERRED Musculoskeletal: Normal range of motion.        General: No tenderness or deformity.  Feet:     Right foot:     Protective Sensation: 10 sites tested. 10 sites sensed.     Skin integrity: Skin integrity normal.     Left foot:     Protective Sensation: 10 sites tested. 10 sites sensed.     Skin integrity: Skin integrity normal.  Lymphadenopathy:     Cervical: No cervical adenopathy.  Skin:    General: Skin is warm and dry.     Findings: No erythema.  Neurological:     Mental Status: She is alert and oriented to person, place, and time.     Cranial Nerves: No cranial nerve deficit.     Motor: No abnormal muscle tone.     Coordination: Coordination normal.     Deep Tendon Reflexes: Reflexes are normal and symmetric. Reflexes normal.  Psychiatric:        Speech: Speech normal.        Behavior: Behavior normal.        Thought Content: Thought content normal.        Judgment: Judgment normal.          Patient has been counseled extensively about  nutrition and exercise as well as the importance of adherence with medications and regular follow-up. The patient was given clear instructions to go to ER or return to medical center if symptoms don't improve, worsen or new problems develop. The patient verbalized understanding.  Follow-up: No follow-ups on file.   Gildardo Pounds, FNP-BC [facility], [place]   07/26/2018, 9:22 AM

## 2018-07-27 LAB — CBC
HEMATOCRIT: 46 % (ref 34.0–46.6)
HEMOGLOBIN: 15.2 g/dL (ref 11.1–15.9)
MCH: 28.9 pg (ref 26.6–33.0)
MCHC: 33 g/dL (ref 31.5–35.7)
MCV: 88 fL (ref 79–97)
Platelets: 215 10*3/uL (ref 150–450)
RBC: 5.26 x10E6/uL (ref 3.77–5.28)
RDW: 12.5 % (ref 12.3–15.4)
WBC: 9.4 10*3/uL (ref 3.4–10.8)

## 2018-07-27 LAB — CMP14+EGFR
ALBUMIN: 3.8 g/dL (ref 3.5–5.5)
ALT: 14 IU/L (ref 0–32)
AST: 17 IU/L (ref 0–40)
Albumin/Globulin Ratio: 1.4 (ref 1.2–2.2)
Alkaline Phosphatase: 71 IU/L (ref 39–117)
BILIRUBIN TOTAL: 0.4 mg/dL (ref 0.0–1.2)
BUN / CREAT RATIO: 16 (ref 9–23)
BUN: 10 mg/dL (ref 6–20)
CHLORIDE: 105 mmol/L (ref 96–106)
CO2: 22 mmol/L (ref 20–29)
Calcium: 8.8 mg/dL (ref 8.7–10.2)
Creatinine, Ser: 0.64 mg/dL (ref 0.57–1.00)
GFR calc non Af Amer: 114 mL/min/{1.73_m2} (ref >59)
GFR, EST AFRICAN AMERICAN: 132 mL/min/{1.73_m2} (ref >59)
GLOBULIN, TOTAL: 2.8 g/dL (ref 1.5–4.5)
GLUCOSE: 96 mg/dL (ref 65–99)
Potassium: 3.8 mmol/L (ref 3.5–5.2)
Sodium: 140 mmol/L (ref 134–144)
TOTAL PROTEIN: 6.6 g/dL (ref 6.0–8.5)

## 2018-07-27 LAB — LIPID PANEL
Chol/HDL Ratio: 4.9 ratio — ABNORMAL HIGH (ref 0.0–4.4)
Cholesterol, Total: 215 mg/dL — ABNORMAL HIGH (ref 100–199)
HDL: 44 mg/dL (ref >39)
LDL Calculated: 139 mg/dL — ABNORMAL HIGH (ref 0–99)
Triglycerides: 161 mg/dL — ABNORMAL HIGH (ref 0–149)
VLDL CHOLESTEROL CAL: 32 mg/dL (ref 5–40)

## 2018-07-27 LAB — VITAMIN D 25 HYDROXY (VIT D DEFICIENCY, FRACTURES): VIT D 25 HYDROXY: 35.5 ng/mL (ref 30.0–100.0)

## 2018-08-06 ENCOUNTER — Telehealth: Payer: Self-pay | Admitting: *Deleted

## 2018-08-06 NOTE — Telephone Encounter (Signed)
-----   Message from Zelda W Fleming, NP sent at 08/04/2018  9:46 PM EST ----- All of your labs are normal except your cholesterol levels. I would recommend you take over the counter omega 3 (2 capsules daily) and exercise at least 5 days a week-30 minutes per day. Avoid red meat, fried foods. junk foods, sodas, sugary drinks, unhealthy snacking, alcohol and smoking.  Drink at least 48oz of water per day and monitor your carbohydrate intake daily. 

## 2018-08-06 NOTE — Telephone Encounter (Signed)
Left message on voicemail to return call to with female.    Notes recorded by Claiborne RiggFleming, Zelda W, NP on 08/04/2018 at 9:46 PM EST All of your labs are normal except your cholesterol levels. I would recommend you take over the counter omega 3 (2 capsules daily) and exercise at least 5 days a week-30 minutes per day. Avoid red meat, fried foods. junk foods, sodas, sugary drinks, unhealthy snacking, alcohol and smoking. Drink at least 48oz of water per day and monitor your carbohydrate intake daily.

## 2018-08-08 NOTE — Telephone Encounter (Signed)
Pt called back for Lab results, please follow up

## 2018-08-14 ENCOUNTER — Telehealth (INDEPENDENT_AMBULATORY_CARE_PROVIDER_SITE_OTHER): Payer: Self-pay

## 2018-08-14 NOTE — Telephone Encounter (Signed)
-----   Message from Claiborne Rigg, NP sent at 08/04/2018  9:46 PM EST ----- All of your labs are normal except your cholesterol levels. I would recommend you take over the counter omega 3 (2 capsules daily) and exercise at least 5 days a week-30 minutes per day. Avoid red meat, fried foods. junk foods, sodas, sugary drinks, unhealthy snacking, alcohol and smoking.  Drink at least 48oz of water per day and monitor your carbohydrate intake daily.

## 2018-08-14 NOTE — Telephone Encounter (Signed)
Call placed using [employer] [phone]) patient is aware that all labs are normal except your cholesterol levels. I would recommend you take over the counter omega 3 (2 capsules daily) and exercise at least 5 days a week-30 minutes per day. Avoid red meat, fried foods. junk foods, sodas, sugary drinks, unhealthy snacking, alcohol and smoking. Drink at least 48oz of water per day and monitor your carbohydrate intake daily. Patient expressed understanding. Maryjean Morn, CMA

## 2018-11-25 ENCOUNTER — Encounter (HOSPITAL_COMMUNITY): Payer: Self-pay

## 2018-11-25 ENCOUNTER — Other Ambulatory Visit: Payer: Self-pay

## 2018-11-25 ENCOUNTER — Telehealth: Payer: Self-pay | Admitting: Nurse Practitioner

## 2018-11-25 ENCOUNTER — Ambulatory Visit (HOSPITAL_COMMUNITY)
Admission: EM | Admit: 2018-11-25 | Discharge: 2018-11-25 | Disposition: A | Discharge status: Home / Self Care | Payer: Self-pay | Attending: Family Medicine | Admitting: Family Medicine

## 2018-11-25 DIAGNOSIS — R05 Cough: Secondary | ICD-10-CM

## 2018-11-25 DIAGNOSIS — R059 Cough, unspecified: Secondary | ICD-10-CM

## 2018-11-25 MED ORDER — BENZONATATE 100 MG PO CAPS: 100.0000 mg | ORAL_CAPSULE | Freq: Three times a day (TID) | ORAL | 0 refills | Status: AC

## 2018-11-25 NOTE — Telephone Encounter (Signed)
Nurse called the patient's home phone number but received no answer and message was left on the voicemail for the patient to call back.  Return phone number given. 

## 2018-11-25 NOTE — Discharge Instructions (Signed)
Please try things such as zyrtec-D or allegra-D which is an antihistamine and decongestant.  Please try honey, vick's vapor rub, lozenges and humidifer for cough and sore throat  Please take tylenol for headache and fever.  Please stay home for another 7 days.  Please follow up if your symptoms fail to improve.

## 2018-11-25 NOTE — Telephone Encounter (Signed)
Pt called in stating she was having chest pains and coughing as well as back pains she answered NO to having a fever and being in contact with someone confirmed or suspected to have covid-19 advised to go to ED

## 2018-11-25 NOTE — ED Notes (Signed)
Patient verbalizes understanding of discharge instructions. Opportunity for questioning and answers were provided. Patient discharged from [facility] by MD. 

## 2018-11-25 NOTE — ED Provider Notes (Signed)
[facility]    CSN: [id number] Arrival date & time: 11/25/18  1739     History   Chief Complaint Chief Complaint  Patient presents with  . Cough  . Headache    HPI Nichole Jackson is a 38 y.o. female.   She is presenting with cough, congestion, and headache.  Her symptoms been ongoing for 1 week.  She does not work and stays at home.  Has not had any travel.  Has not had any fevers.  Is not having exposure new with similar symptoms.  Has not had or tried any home modalities.  No history of allergies or asthma.  Feels her symptoms are staying the same.  HPI  Past Medical History:  Diagnosis Date  . Chronic kidney disease    stones  . Fatty liver   . Hyperlipidemia   . Hypertension   . No pertinent past medical history   . Peptic ulcer     Patient Active Problem List   Diagnosis Date Noted  . Chronic abdominal pain 01/26/2016  . Bilateral headaches 11/12/2015  . Preventative health care 11/12/2015  . Vitamin D deficiency 11/12/2015  . Screening 09/09/2014    Past Surgical History:  Procedure Laterality Date  . CESAREAN SECTION     x 2  . CESAREAN SECTION  07/27/2011   Procedure: CESAREAN SECTION;  Surgeon: Lesly Dukes, MD;  Location: [facility];  Service: Gynecology;  Laterality: N/A;  Previous classical    OB History    Gravida  3   Para  3   Term  3   Preterm      AB      Living  3     SAB      TAB      Ectopic      Multiple      Live Births  1            Home Medications    Prior to Admission medications   Medication Sig Start Date End Date Taking? Authorizing Provider  benzonatate (TESSALON) 100 MG capsule Take 1 capsule (100 mg total) by mouth every 8 (eight) hours. 11/25/18   Myra Rude, MD  naproxen (NAPROSYN) 500 MG tablet Take 1 tablet (500 mg total) by mouth 2 (two) times daily with a meal. Prn pain Patient not taking: Reported on 07/26/2018 03/06/18   Anders Simmonds, PA-C    Family History  Family History  Problem Relation Age of Onset  . Hypertension Mother   . Diabetes Mother   . Hypertension Maternal Grandmother   . Diabetes Maternal Grandmother   . Heart disease Maternal Grandmother     Social History Social History   Tobacco Use  . Smoking status: Never Smoker  . Smokeless tobacco: Never Used  Substance Use Topics  . Alcohol use: No    Alcohol/week: 0.0 standard drinks  . Drug use: No     Allergies   Patient has no known allergies.   Review of Systems Review of Systems  Constitutional: Negative for fever.  HENT: Positive for congestion.   Respiratory: Positive for cough.   Musculoskeletal: Negative for back pain.  Skin: Negative for color change.  Neurological: Positive for headaches.  Hematological: Negative for adenopathy.  Psychiatric/Behavioral: Negative for agitation.     Physical Exam Triage Vital Signs ED Triage Vitals 11/25/18 1759  Enc Vitals Group     BP (!) 146/87     Pulse Rate 94  Resp 17     Temp 99.1 F (37.3 C)     Temp Source Oral     SpO2 100 %     Weight      Height      Head Circumference      Peak Flow      Pain Score      Pain Loc      Pain Edu?      Excl. in GC?    No data found.  Updated Vital Signs BP (!) 146/87 (BP Location: Left Arm)   Pulse 94   Temp 99.1 F (37.3 C) (Oral)   Resp 17   SpO2 100%   Visual Acuity Right Eye Distance:   Left Eye Distance:   Bilateral Distance:    Right Eye Near:   Left Eye Near:    Bilateral Near:     Physical Exam Gen: NAD, alert, cooperative with exam,  ENT: normal lips, normal nasal mucosa, tympanic membranes clear and intact bilaterally, normal oropharynx, no cervical lymphadenopathy Eye: normal EOM, normal conjunctiva and lids CV:  no edema, +2 pedal pulses, regular rate and rhythm, S1-S2   Resp: no accessory muscle use, non-labored, clear to auscultation bilaterally, no crackles or wheezes Skin: no rashes, no areas of induration  Neuro: normal  tone, normal sensation to touch Psych:  normal insight, alert and oriented MSK: Normal gait, normal strength    UC Treatments / Results  Labs (all labs ordered are listed, but only abnormal results are displayed) Labs Reviewed - No data to display  EKG None  Radiology No results found.  Procedures Procedures (including critical care time)  Medications Ordered in UC Medications - No data to display  Initial Impression / Assessment and Plan / UC Course  I have reviewed the triage vital signs and the nursing notes.  Pertinent labs & imaging results that were available during my care of the patient were reviewed by me and considered in my medical decision making (see chart for details).     Nichole Jackson is a 38 yo F that presenting with cough, congestion, and headache.  Possible for viral component.  Has not had fevers.  So possible for coronavirus.  Counseled on supportive care.  Provided with Tessalon.  Given indications to follow-up and return.  Final Clinical Impressions(s) / UC Diagnoses   Final diagnoses:  Cough     Discharge Instructions     Please try things such as zyrtec-D or allegra-D which is an antihistamine and decongestant.  Please try honey, vick's vapor rub, lozenges and humidifer for cough and sore throat  Please take tylenol for headache and fever.  Please stay home for another 7 days.  Please follow up if your symptoms fail to improve.      ED Prescriptions    Medication Sig Dispense Auth. Provider   benzonatate (TESSALON) 100 MG capsule Take 1 capsule (100 mg total) by mouth every 8 (eight) hours. 21 capsule Myra RudeSchmitz, Aviella Disbrow E, MD     Controlled Substance Prescriptions [place] Controlled Substance Registry consulted? Not Applicable   Myra RudeSchmitz, Zaydon Kinser E, MD 11/25/18 [phone]

## 2018-11-25 NOTE — ED Triage Notes (Signed)
Patient presents to Urgent Care with complaints of cough and headache for a week and chest tightness since yesterday. Patient states she has not had a fever, cough is dry.

## 2018-11-25 NOTE — Telephone Encounter (Signed)
Patients call taken.  Patient identified by name and date of birth.  Patient states that she has bilateral chest pain for two days.  Pain is described as dull.  Patient states the pain is 3/10.  Patient states it is constant.  Patient aslo endorses a cough. Cough has been present for a week.  Patient denies shortness of breath or other COVID-19 factors.  Patient advised to go to [facility].   Patient acknowledged understanding of advice.

## 2019-04-29 IMAGING — US US ABDOMEN LIMITED
1 series · 14 of 25 positions shown · non-contrast
Comparison: CT abdomen and pelvis 12/22/2016

CLINICAL DATA: Right upper quadrant pain.

EXAM:
ULTRASOUND ABDOMEN LIMITED RIGHT UPPER QUADRANT

[Series 1: us abdomen limited · 0.25mm/px · 14 of 56 slices shown]
[im 1/56]
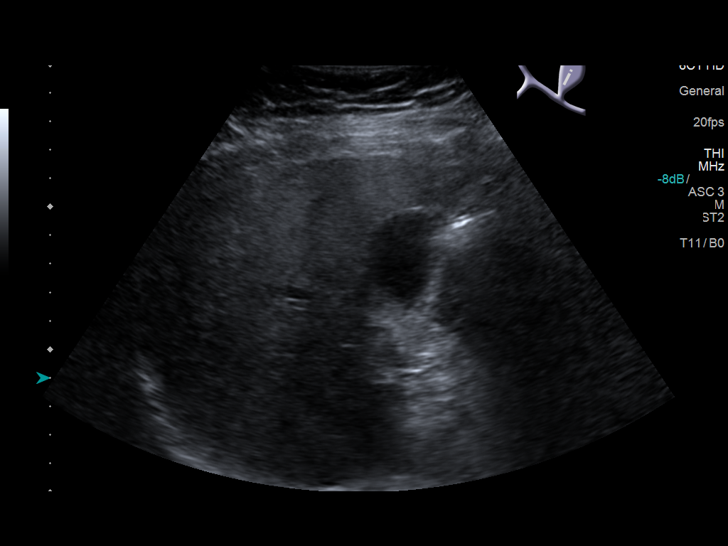
[im 5/56]
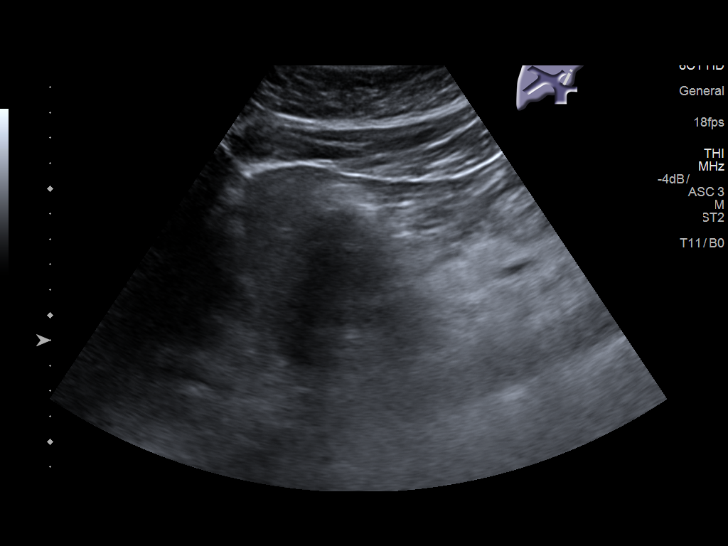
[im 10/56]
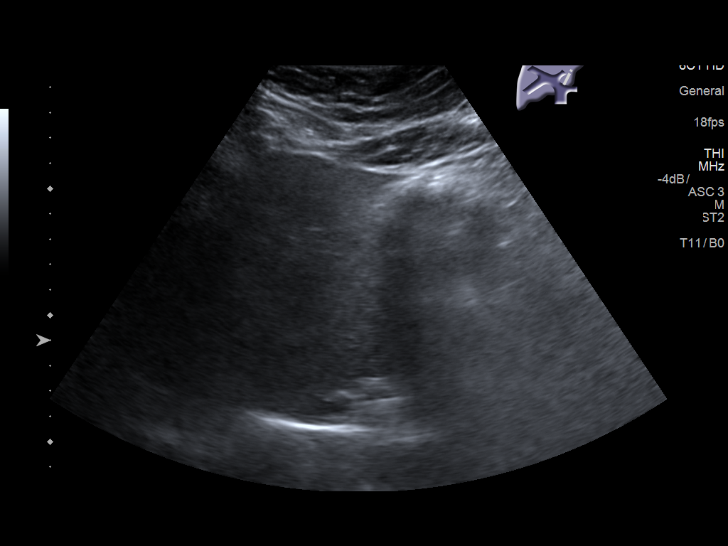
[im 14/56]
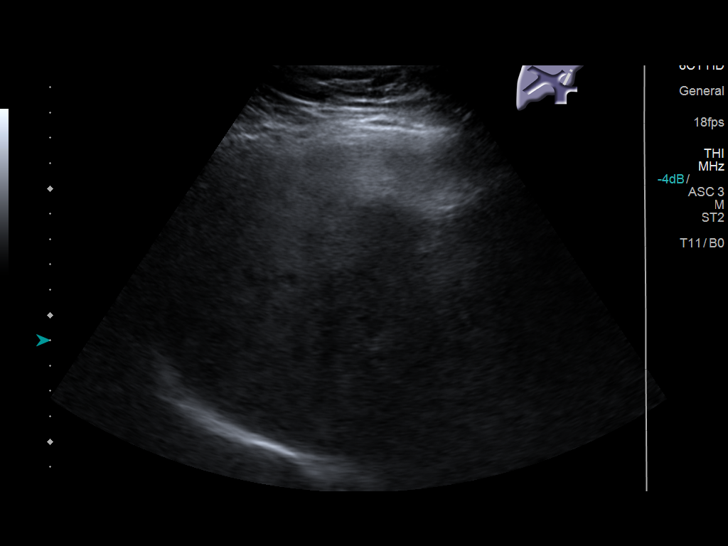
[im 19/56]
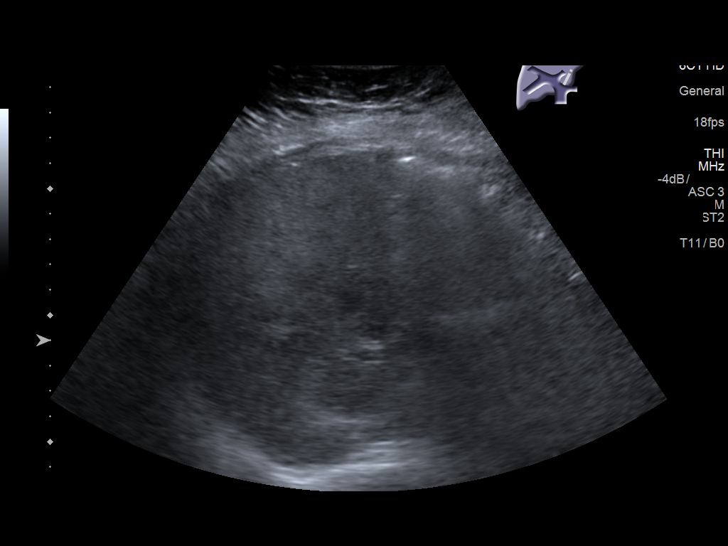
[im 21/56]
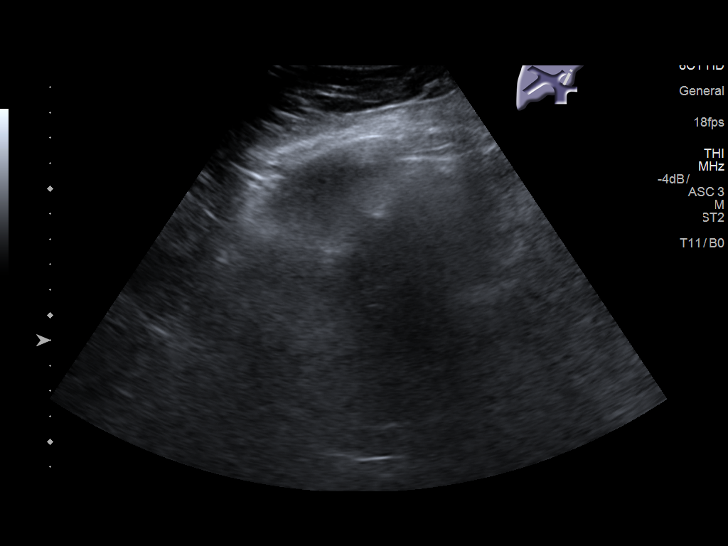
[im 26/56]
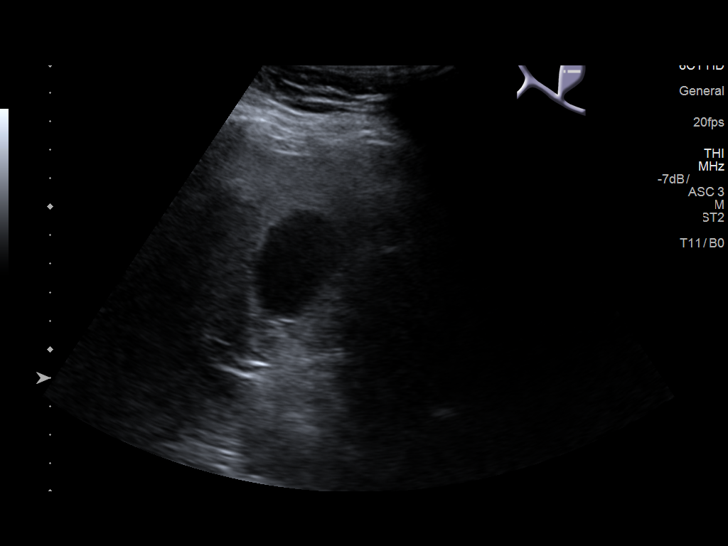
[im 30/56]
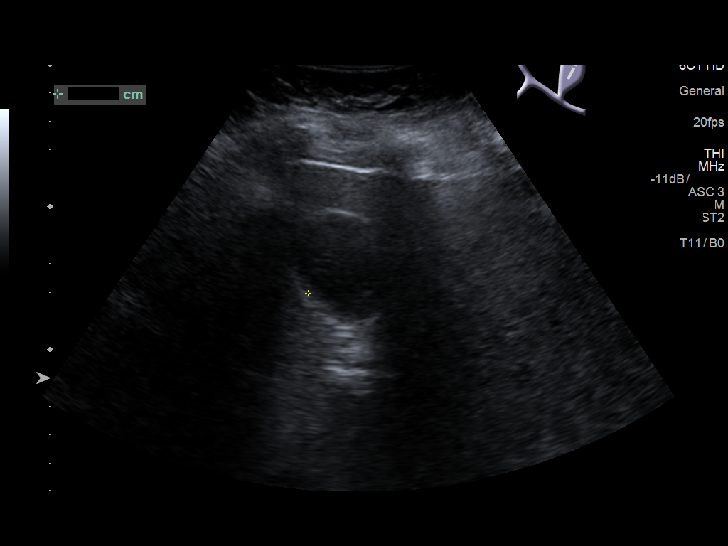
[im 35/56]
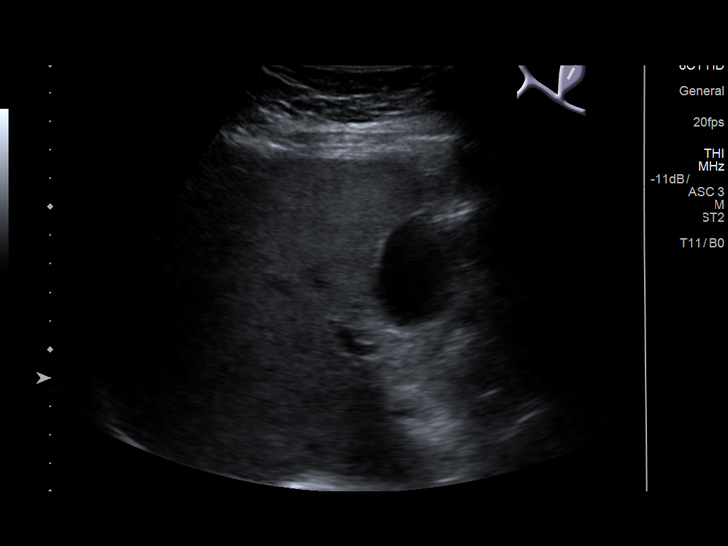
[im 37/56]
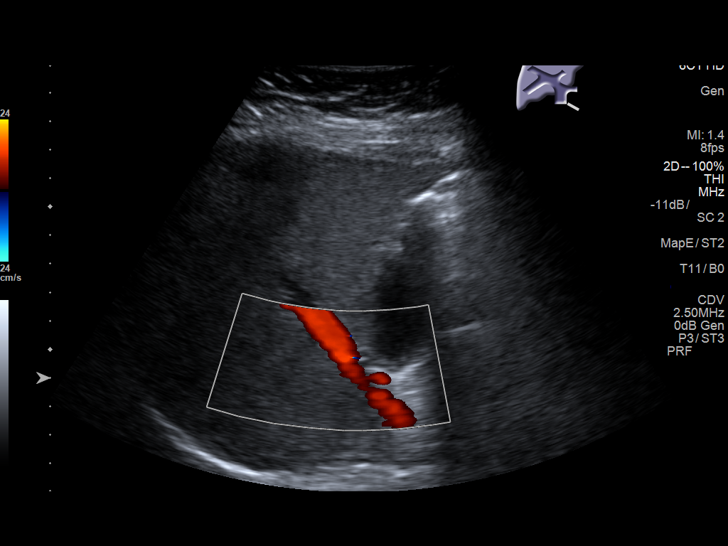
[im 42/56]
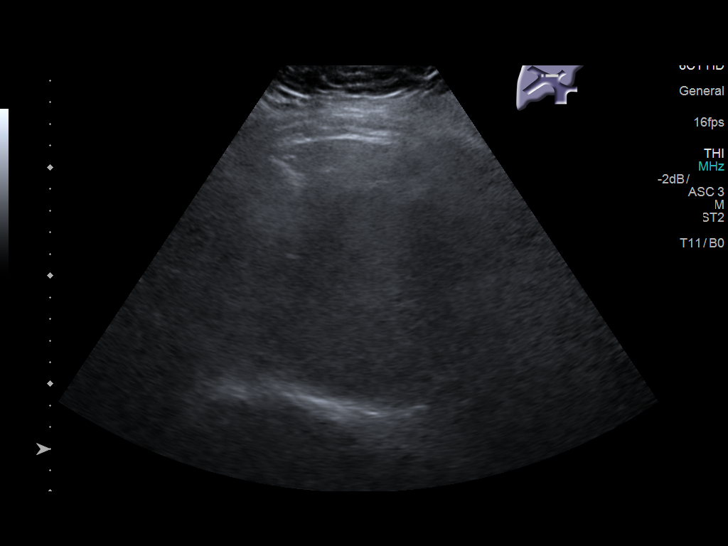
[im 46/56]
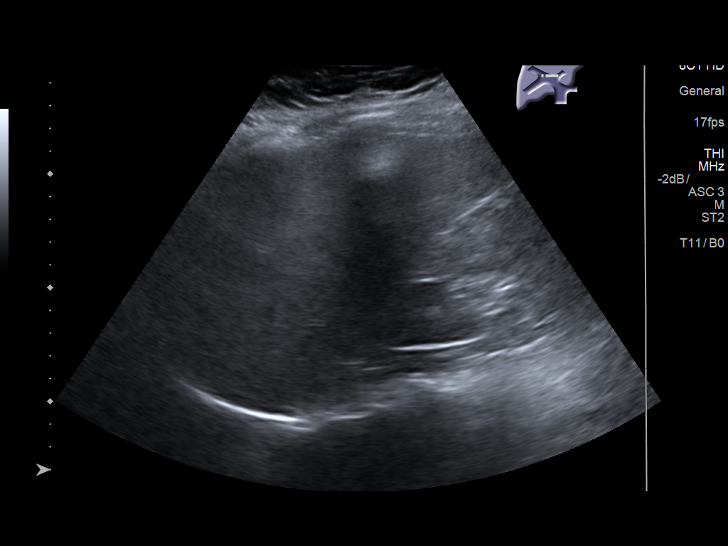
[im 51/56]
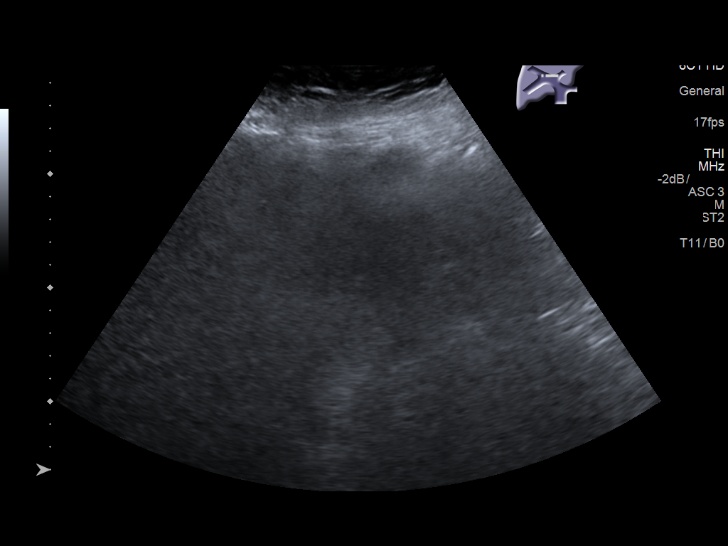
[im 56/56]
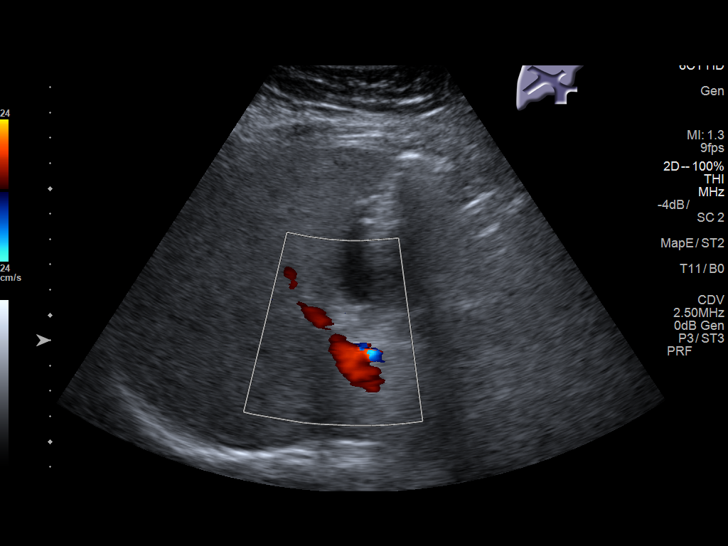

[14 of 25 positions shown; findings below may reference images not displayed]

FINDINGS: Gallbladder:

No gallstones or wall thickening visualized. No sonographic Murphy
sign noted by sonographer.

Common bile duct:

Diameter: 3.5 mm, normal

Liver:

Diffusely increased hepatic parenchymal echotexture consistent with
diffuse fatty infiltration. Portal vein is patent on color Doppler
imaging with normal direction of blood flow towards the liver.
IMPRESSION: No evidence of cholelithiasis or cholecystitis. Diffuse fatty
infiltration of the liver.

## 2019-07-12 IMAGING — US US ABDOMEN LIMITED
1 series · 14 of 25 positions shown · non-contrast
Comparison: Prior CT and ultrasound from 11/29/2017

CLINICAL DATA: Initial evaluation for acute right upper quadrant
pain.

EXAM:
ULTRASOUND ABDOMEN LIMITED RIGHT UPPER QUADRANT

[Series 1: us abdomen limited · 0.26mm/px · 14 of 38 slices shown]
[im 1/38]
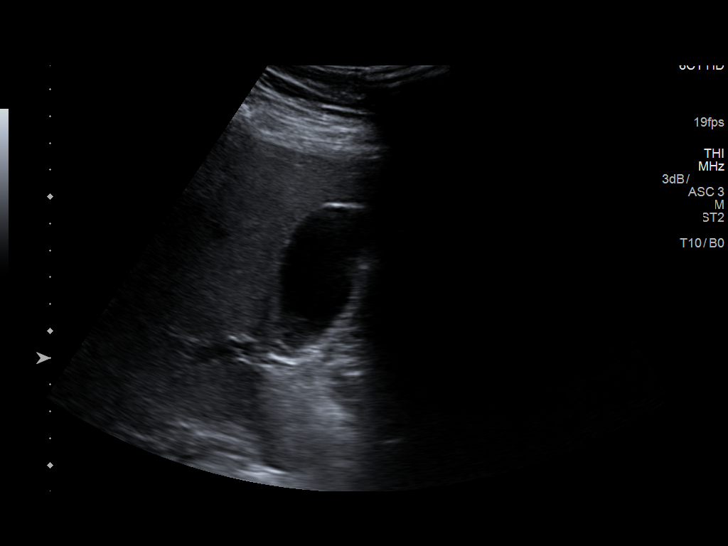
[im 4/38]
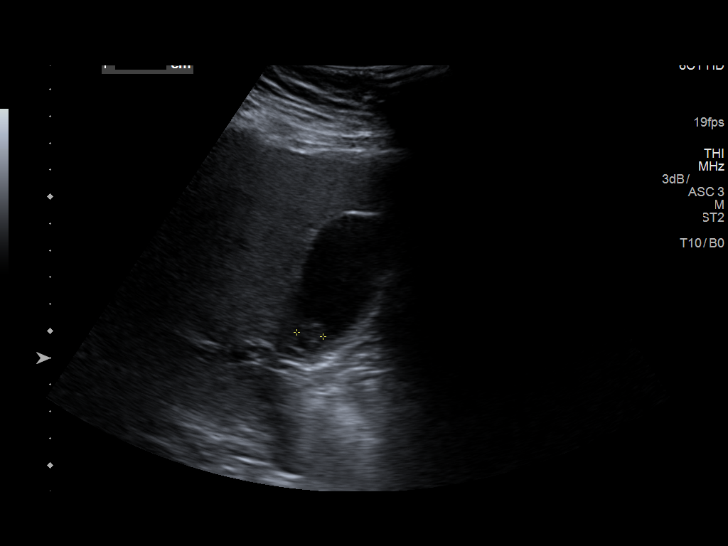
[im 7/38]
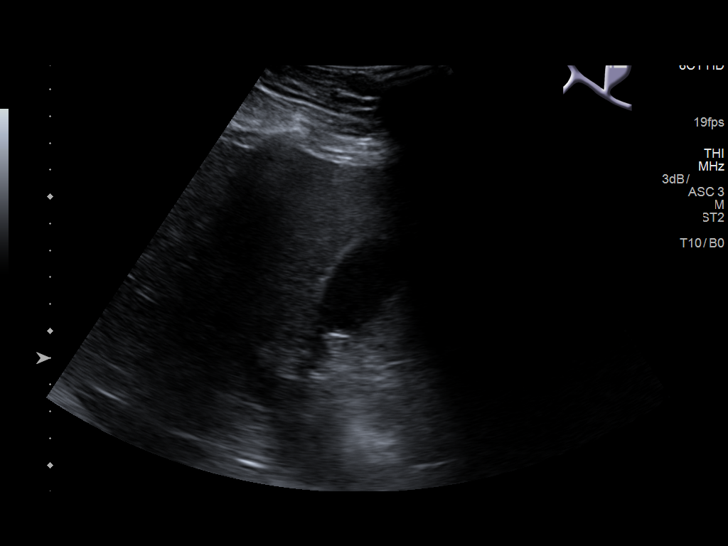
[im 10/38]
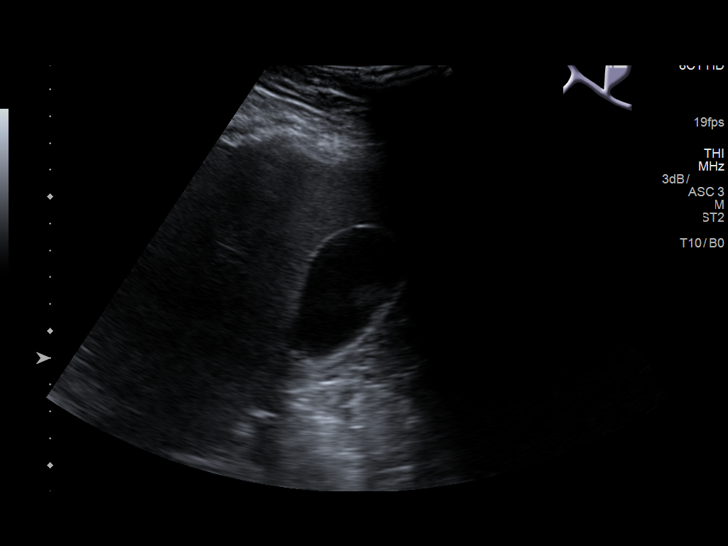
[im 13/38]
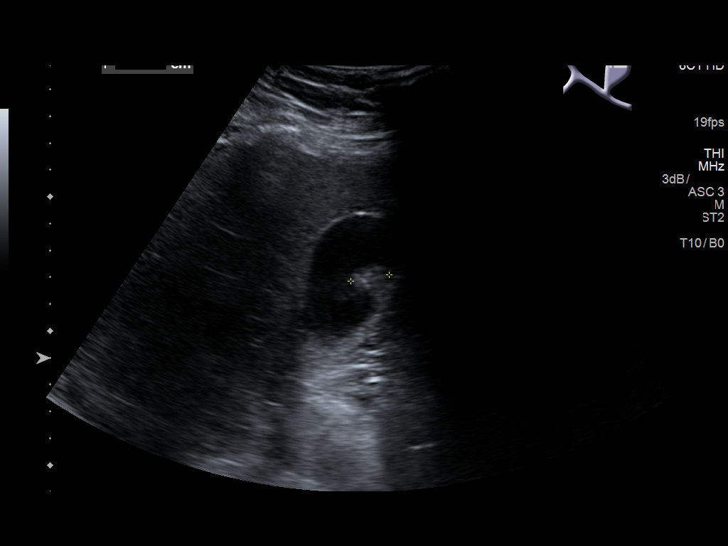
[im 14/38]
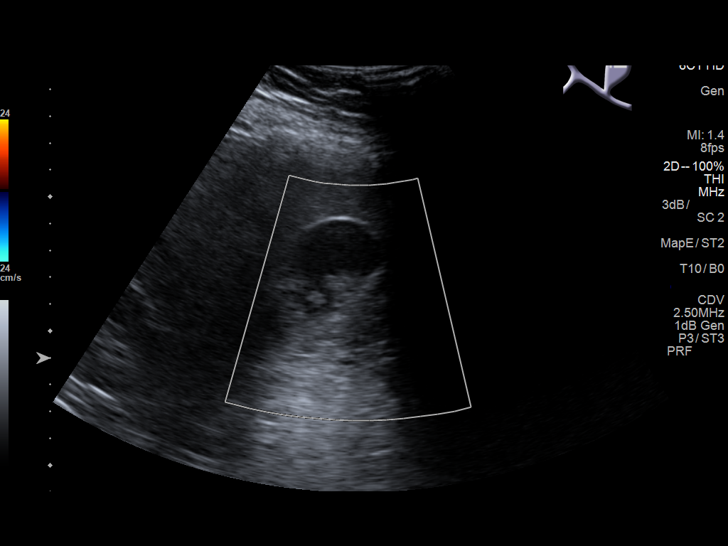
[im 17/38]
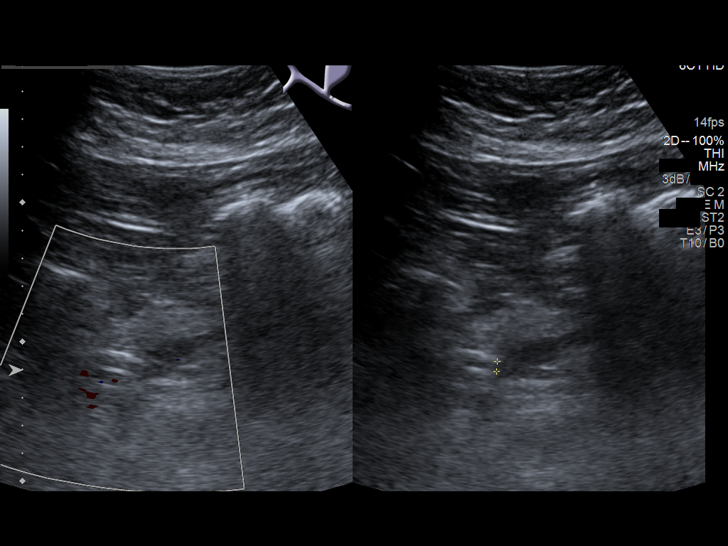
[im 21/38]
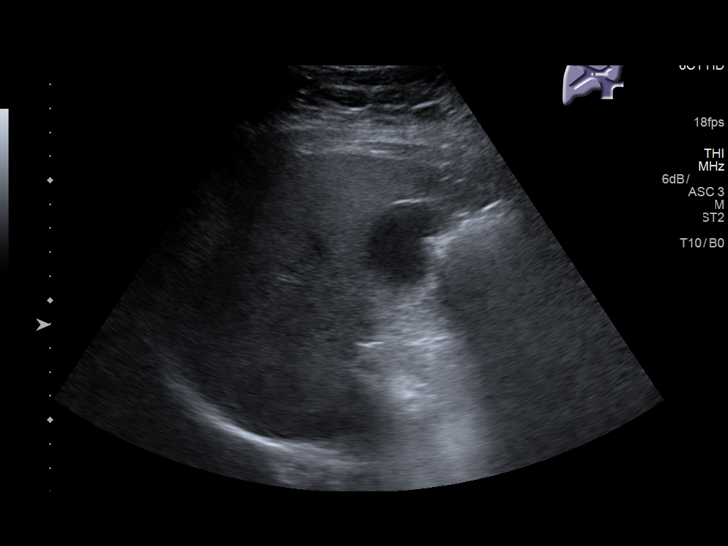
[im 24/38]
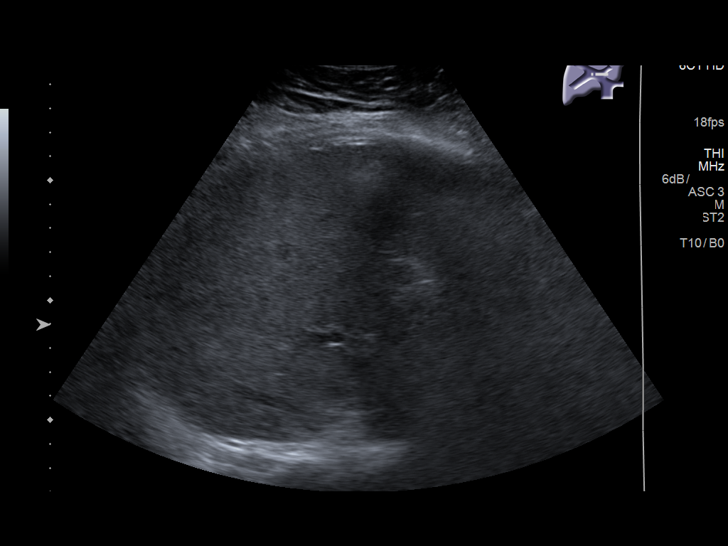
[im 25/38]
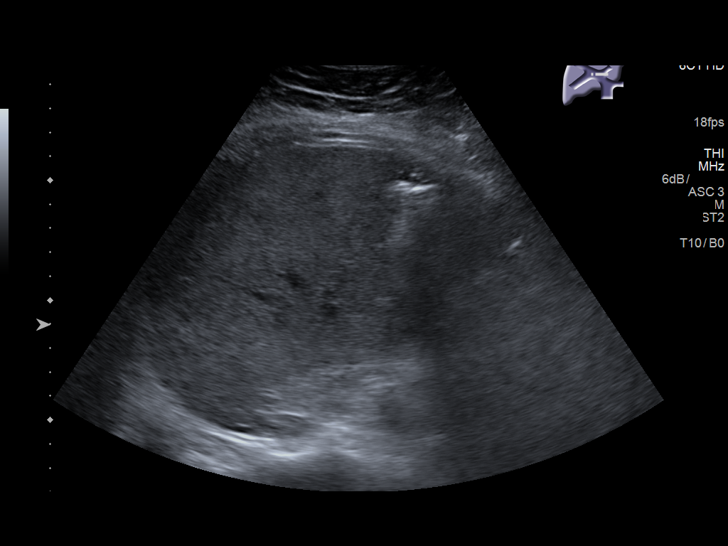
[im 28/38]
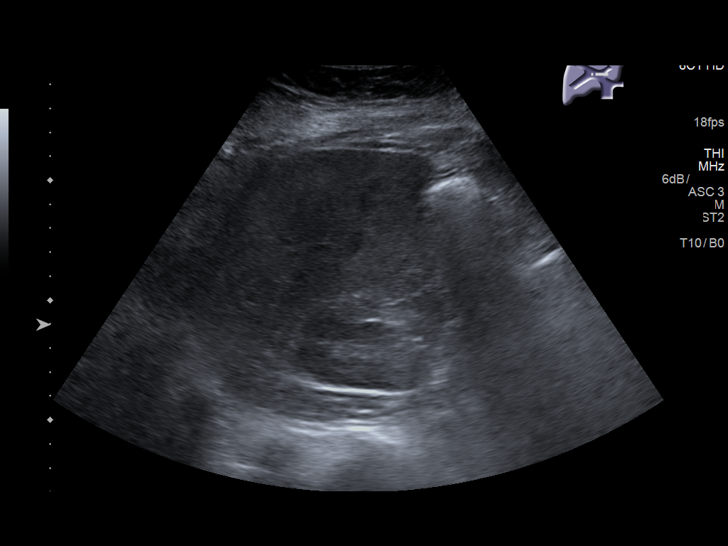
[im 31/38]
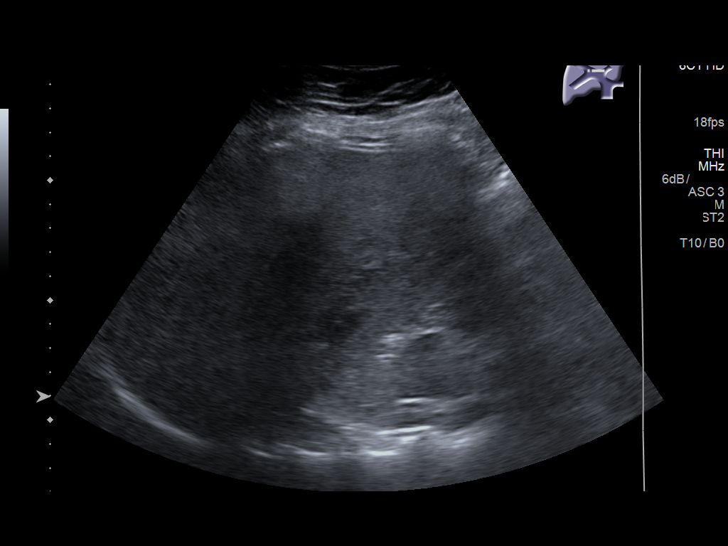
[im 34/38]
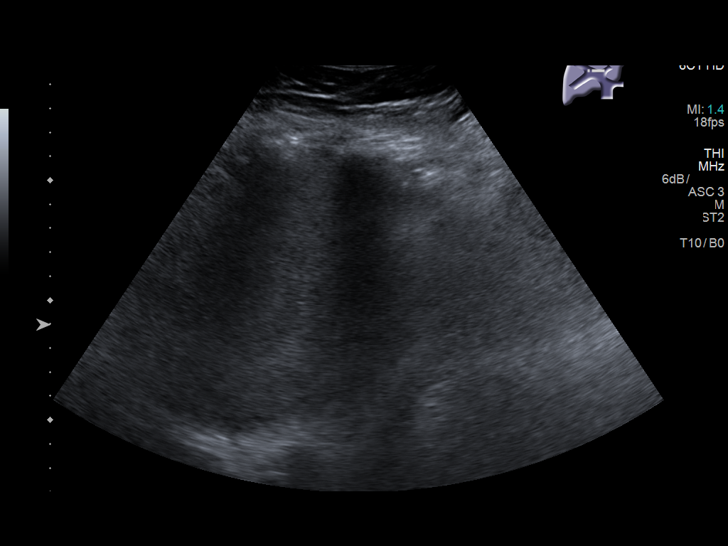
[im 38/38]
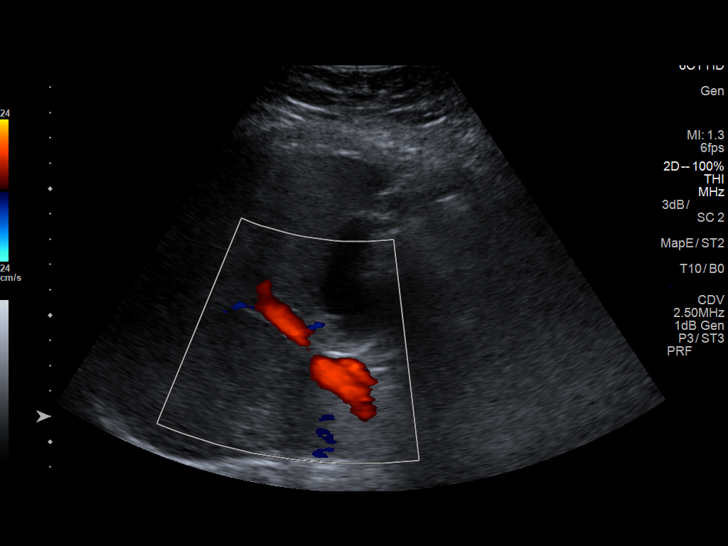

[14 of 25 positions shown; findings below may reference images not displayed]

FINDINGS: Gallbladder:

Internal sludge seen within the gallbladder lumen without frank
echogenic shadowing stones. Gallbladder wall measure within normal
limits at 2.2 mm. No free pericholecystic fluid. No sonographic
Murphy sign elicited on exam.

Common bile duct:

Diameter: 3.6 mm

Liver:

No focal lesion identified. Increased echogenicity within the liver
parenchyma, suggesting steatosis. Portal vein is patent on color
Doppler imaging with normal direction of blood flow towards the
liver.
IMPRESSION: 1. Gallbladder sludge without evidence for frank cholelithiasis or
acute cholecystitis.
2. No biliary dilatation.
3. Increased hepatic echogenicity, suggesting steatosis.

## 2019-08-21 IMAGING — CT CT ABD-PELV W/O CM
2 of 4 series · 15 of 46 positions shown, 17 images · non-contrast
Comparison: Ultrasound abdomen 11/29/2017. CT abdomen and pelvis
12/22/2016

CLINICAL DATA: Abdominal pain.

EXAM:
CT ABDOMEN AND PELVIS WITHOUT CONTRAST
TECHNIQUE: Multidetector CT imaging of the abdomen and pelvis was performed
following the standard protocol without IV contrast.

[Series 3: ap without · axial · non-contrast · 0.76mm/px · z∈[+603,+1073]mm · 12 of 106 slices shown, 14 images]
[im 6/106  soft-tissue]
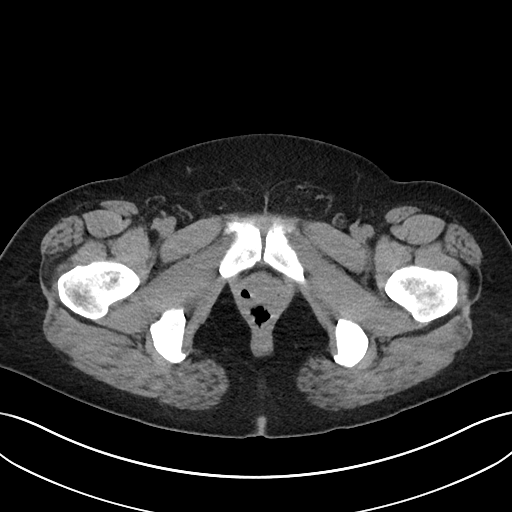
[im 6/106  bone]
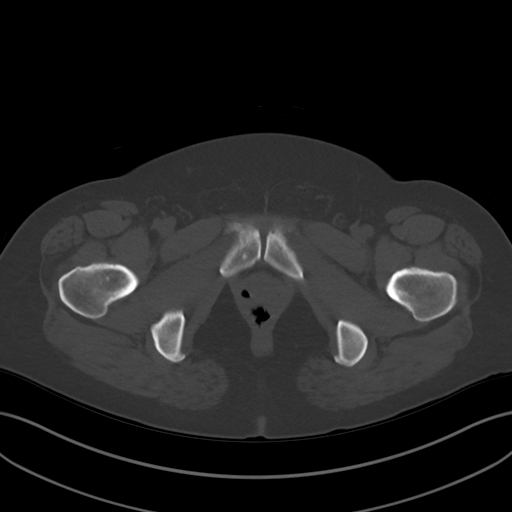
[im 17/106  soft-tissue]
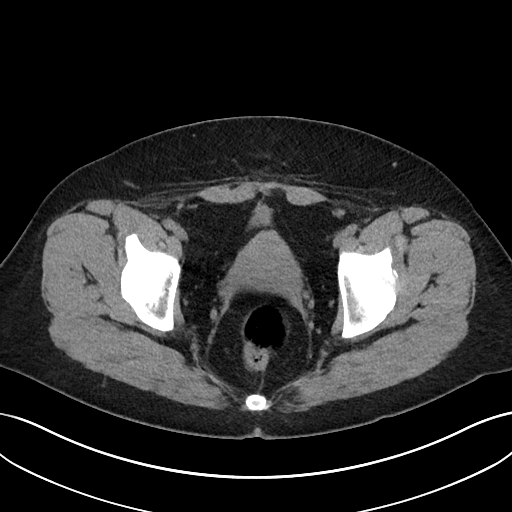
[im 23/106  soft-tissue]
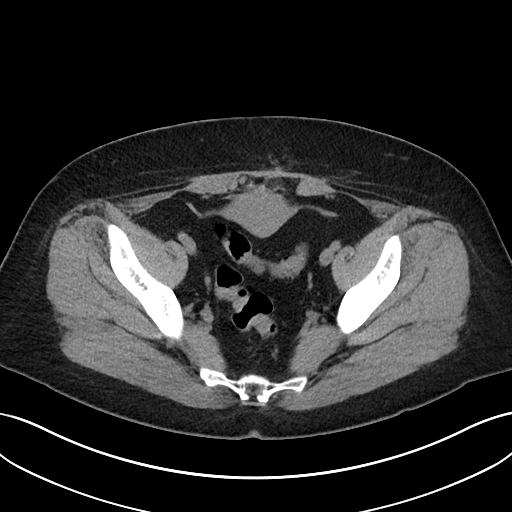
[im 34/106  soft-tissue]
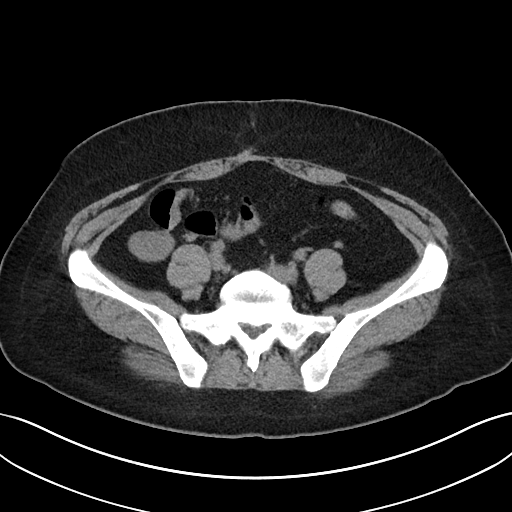
[im 39/106  soft-tissue]
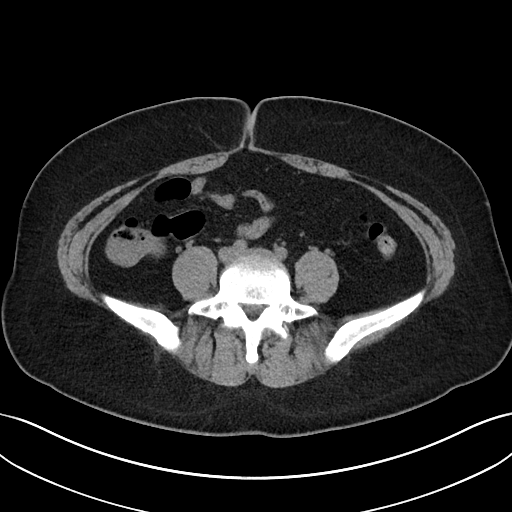
[im 50/106  soft-tissue]
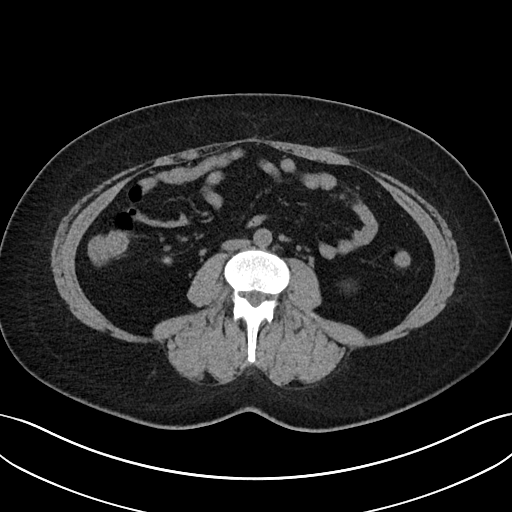
[im 56/106  soft-tissue]
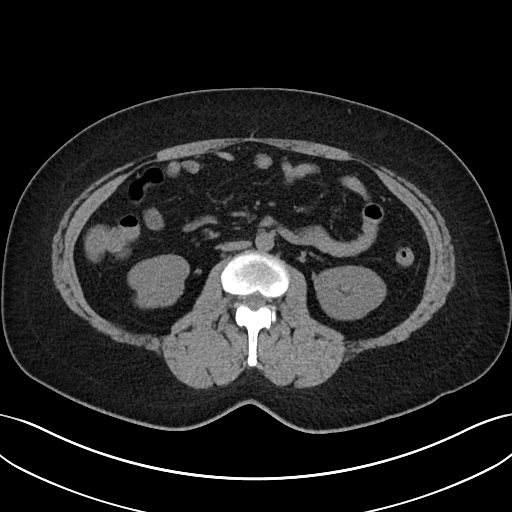
[im 67/106  soft-tissue]
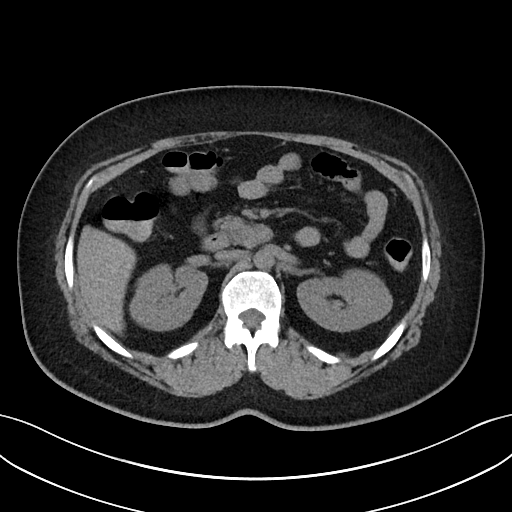
[im 72/106  soft-tissue]
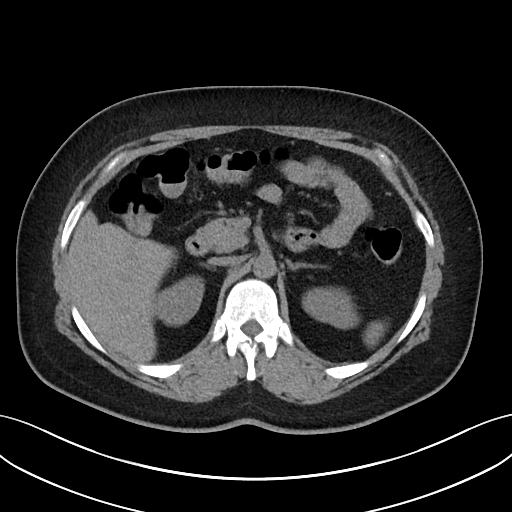
[im 72/106  bone]
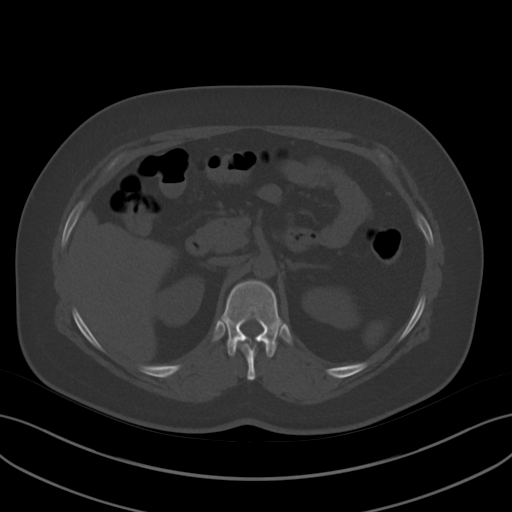
[im 83/106  soft-tissue]
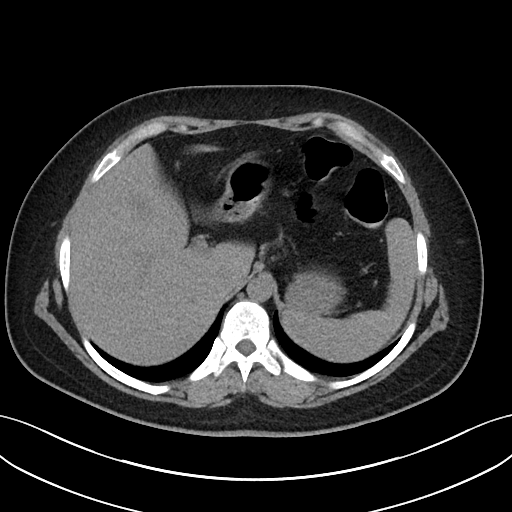
[im 89/106  soft-tissue]
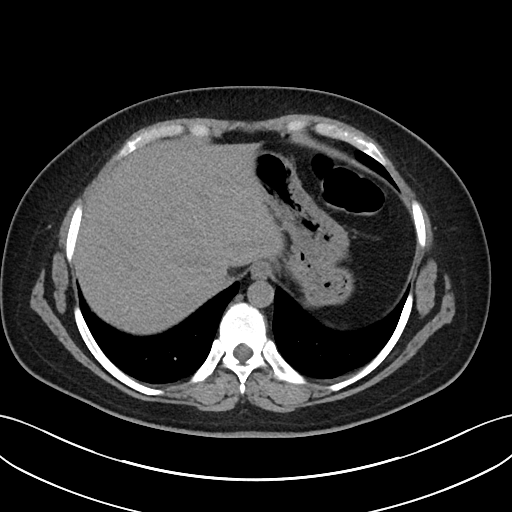
[im 100/106  soft-tissue]
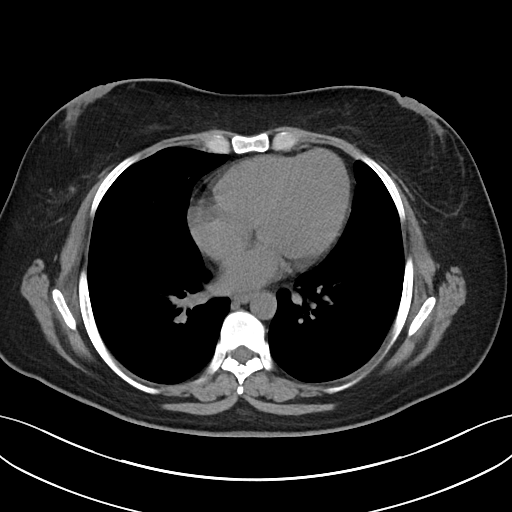

[Series 6: cor · coronal · 0.71mm/px · 3 of 81 slices shown]
[im 27/81  soft-tissue]
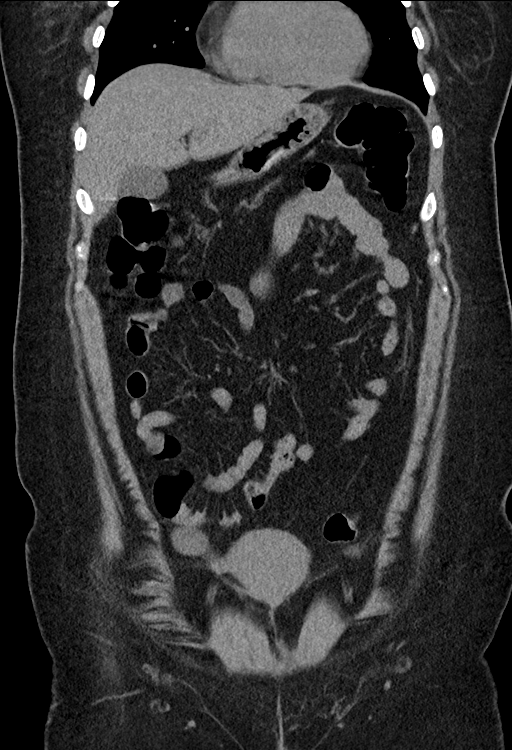
[im 36/81  soft-tissue]
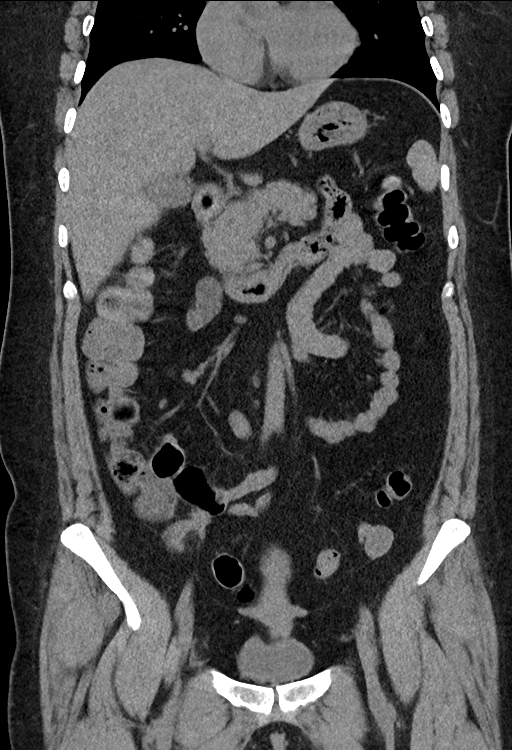
[im 45/81  soft-tissue]
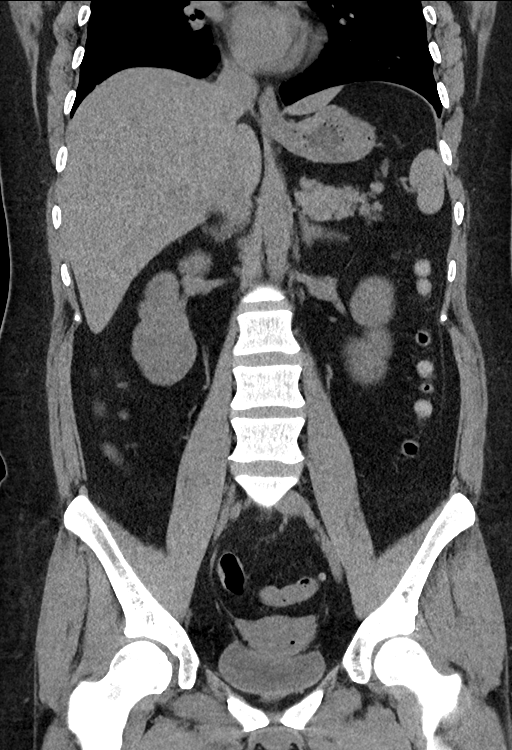

[15 of 46 positions shown; findings below may reference images not displayed]

FINDINGS: Lower chest: The lung bases are clear.

Hepatobiliary: Mild diffuse fatty infiltration of the liver. No
focal lesions identified. Gallbladder and bile ducts are
unremarkable.

Pancreas: Unremarkable. No pancreatic ductal dilatation or
surrounding inflammatory changes.

Spleen: Normal in size without focal abnormality.

Adrenals/Urinary Tract: Adrenal glands are unremarkable. Kidneys are
normal, without renal calculi, focal lesion, or hydronephrosis.
Bladder is unremarkable.

Stomach/Bowel: Stomach is within normal limits. Appendix appears
normal. No evidence of bowel wall thickening, distention, or
inflammatory changes.

Vascular/Lymphatic: No significant vascular findings are present. No
enlarged abdominal or pelvic lymph nodes.

Reproductive: 3.4 cm cyst on the right ovary, likely representing
benign physiologic cyst. Uterus and left ovary are unremarkable.

Other: No free air or free fluid in the abdomen. Scarring in the low
pelvis anterior wall consistent with postoperative scar. Abdominal
wall musculature appears intact.

Musculoskeletal: No acute or significant osseous findings.
IMPRESSION: 1. No acute process demonstrated in the abdomen or pelvis. No
evidence of bowel obstruction or inflammation.
2. Mild diffuse fatty infiltration of the liver.
3. Probable physiologic cyst on the right ovary. No abnormal adnexal
masses.
